# Patient Record
Sex: Female | Born: 1995 | Race: Black or African American | Hispanic: No | Marital: Single | Smoking: Never smoker
Health system: Southern US, Community
[De-identification: ages and names within clinical notes are randomized; demographics above are authoritative.]

## PROBLEM LIST (undated history)

## (undated) ENCOUNTER — Inpatient Hospital Stay (HOSPITAL_COMMUNITY): Payer: Self-pay

## (undated) DIAGNOSIS — J302 Other seasonal allergic rhinitis: Secondary | ICD-10-CM

## (undated) DIAGNOSIS — F329 Major depressive disorder, single episode, unspecified: Secondary | ICD-10-CM

## (undated) DIAGNOSIS — F32A Depression, unspecified: Secondary | ICD-10-CM

## (undated) HISTORY — DX: Major depressive disorder, single episode, unspecified: F32.9

## (undated) HISTORY — DX: Depression, unspecified: F32.A

## (undated) HISTORY — PX: NO PAST SURGERIES: SHX2092

## (undated) HISTORY — PX: ACHILLES TENDON REPAIR: SUR1153

---
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
## 2013-01-10 ENCOUNTER — Encounter (HOSPITAL_COMMUNITY): Payer: Self-pay

## 2013-01-10 ENCOUNTER — Inpatient Hospital Stay (HOSPITAL_COMMUNITY)
Admission: AD | Admit: 2013-01-10 | Discharge: 2013-01-10 | Disposition: A | Discharge status: Home / Self Care | Payer: Federal, State, Local not specified - PPO | Source: Ambulatory Visit | Attending: Obstetrics & Gynecology | Admitting: Obstetrics & Gynecology

## 2013-01-10 DIAGNOSIS — Z3201 Encounter for pregnancy test, result positive: Secondary | ICD-10-CM | POA: Insufficient documentation

## 2013-01-10 LAB — POCT PREGNANCY, URINE: Preg Test, Ur: POSITIVE — AB

## 2013-01-10 NOTE — MAU Provider Note (Signed)
  History     CSN: [id number]  Arrival date and time: 01/10/13 2312   None     No chief complaint on file.  HPI  Stacey Rhodes is a 17 y.o. who presents today for a pregnancy test. She states that she took one at home what was positive, but she wanted to confirm. She denies any pain or bleeding.   Past Medical History  Diagnosis Date  . Asthma   . Seasonal allergies     No past surgical history on file.  Family History  Problem Relation Age of Onset  . Diabetes Other   . Cancer Other   . Hyperlipidemia Other   . Asthma Other     History  Substance Use Topics  . Smoking status: Never Smoker   . Smokeless tobacco: Not on file  . Alcohol Use: No    Allergies: No Known Allergies  No prescriptions prior to admission    ROS Physical Exam   Blood pressure 112/68, pulse 82, temperature 99.5 F (37.5 C), temperature source Oral, resp. rate 16, height 5\' 7"  (1.702 m), weight 68.765 kg (151 lb 9.6 oz), last menstrual period 11/08/2012, SpO2 100.00%.  Physical Exam  MAU Course  Procedures  Results for orders placed during the hospital encounter of 01/10/13 (from the past 24 hour(s))  POCT PREGNANCY, URINE     Status: Abnormal   Collection Time    01/10/13 11:30 PM      Result Value Range   Preg Test, Ur POSITIVE (*) NEGATIVE     Assessment and Plan   1. Encounter for pregnancy test, result positive    First trimester warning signs reviewed Start [facility] as soon as possible Return to MAU as needed  Tawnya Crook 01/10/2013, 11:32 PM

## 2013-01-10 NOTE — MAU Note (Signed)
Pt states took pregnancy test at home 2 days ago and it was positive. Wanted confirmation here b/c wasn't sure if she could trust the brand she used. Denies vaginal bleeding or abdominal pain. States just wants to confirm pregnancy. Unsure of LMP, thinks it was the middle of May.

## 2013-01-10 NOTE — MAU Provider Note (Signed)

## 2013-01-27 ENCOUNTER — Inpatient Hospital Stay (HOSPITAL_COMMUNITY)
Admission: AD | Admit: 2013-01-27 | Discharge: 2013-01-28 | Disposition: A | Discharge status: Home / Self Care | Payer: Federal, State, Local not specified - PPO | Source: Ambulatory Visit | Attending: Obstetrics and Gynecology | Admitting: Obstetrics and Gynecology

## 2013-01-27 DIAGNOSIS — A499 Bacterial infection, unspecified: Secondary | ICD-10-CM | POA: Insufficient documentation

## 2013-01-27 DIAGNOSIS — O209 Hemorrhage in early pregnancy, unspecified: Secondary | ICD-10-CM | POA: Insufficient documentation

## 2013-01-27 DIAGNOSIS — B9689 Other specified bacterial agents as the cause of diseases classified elsewhere: Secondary | ICD-10-CM | POA: Insufficient documentation

## 2013-01-27 DIAGNOSIS — S3991XA Unspecified injury of abdomen, initial encounter: Secondary | ICD-10-CM

## 2013-01-27 DIAGNOSIS — O2390 Unspecified genitourinary tract infection in pregnancy, unspecified trimester: Secondary | ICD-10-CM | POA: Insufficient documentation

## 2013-01-27 DIAGNOSIS — N760 Acute vaginitis: Secondary | ICD-10-CM | POA: Insufficient documentation

## 2013-01-27 DIAGNOSIS — R109 Unspecified abdominal pain: Secondary | ICD-10-CM | POA: Insufficient documentation

## 2013-01-27 DIAGNOSIS — O26899 Other specified pregnancy related conditions, unspecified trimester: Secondary | ICD-10-CM

## 2013-01-27 LAB — URINE MICROSCOPIC-ADD ON

## 2013-01-27 LAB — URINALYSIS, ROUTINE W REFLEX MICROSCOPIC
Glucose, UA: NEGATIVE mg/dL
Nitrite: NEGATIVE
Specific Gravity, Urine: >1.03 — ABNORMAL HIGH (ref 1.005–1.030)
pH: 6 (ref 5.0–8.0)
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
## 2013-01-28 ENCOUNTER — Inpatient Hospital Stay (HOSPITAL_COMMUNITY): Payer: Federal, State, Local not specified - PPO

## 2013-01-28 ENCOUNTER — Encounter (HOSPITAL_COMMUNITY): Payer: Self-pay

## 2013-01-28 LAB — ABO/RH: ABO/RH(D): A POS

## 2013-01-28 LAB — WET PREP, GENITAL: Trich, Wet Prep: NONE SEEN

## 2013-01-28 MED ORDER — METRONIDAZOLE 500 MG PO TABS: 500.0000 mg | ORAL_TABLET | Freq: Two times a day (BID) | ORAL | Status: AC

## 2013-01-28 MED ORDER — ACETAMINOPHEN 500 MG PO TABS
1000.0000 mg | ORAL_TABLET | Freq: Once | ORAL | Status: AC
  Administered 2013-01-28: 1000 mg via ORAL
  Filled 2013-01-28: qty 2
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
## 2013-01-28 NOTE — MAU Provider Note (Signed)
Reviewed and agree with note and plan. V.Nicola Quesnell, MD  
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
## 2013-01-29 LAB — URINE CULTURE

## 2013-01-29 LAB — GC/CHLAMYDIA PROBE AMP: GC Probe RNA: NEGATIVE

## 2013-02-16 ENCOUNTER — Encounter (HOSPITAL_COMMUNITY): Payer: Self-pay | Admitting: *Deleted

## 2013-02-16 ENCOUNTER — Inpatient Hospital Stay (HOSPITAL_COMMUNITY)
Admission: AD | Admit: 2013-02-16 | Discharge: 2013-02-17 | Disposition: A | Discharge status: Home / Self Care | Payer: Federal, State, Local not specified - PPO | Source: Ambulatory Visit | Attending: Obstetrics & Gynecology | Admitting: Obstetrics & Gynecology

## 2013-02-16 DIAGNOSIS — O99891 Other specified diseases and conditions complicating pregnancy: Secondary | ICD-10-CM | POA: Insufficient documentation

## 2013-02-16 DIAGNOSIS — R109 Unspecified abdominal pain: Secondary | ICD-10-CM | POA: Insufficient documentation

## 2013-02-16 DIAGNOSIS — K219 Gastro-esophageal reflux disease without esophagitis: Secondary | ICD-10-CM

## 2013-02-16 DIAGNOSIS — O26899 Other specified pregnancy related conditions, unspecified trimester: Secondary | ICD-10-CM

## 2013-02-16 MED ORDER — GI COCKTAIL ~~LOC~~
30.0000 mL | Freq: Once | ORAL | Status: AC
  Administered 2013-02-17: 30 mL via ORAL
  Filled 2013-02-16: qty 30

## 2013-02-16 NOTE — MAU Provider Note (Signed)
Chief Complaint: Abdominal Pain  First Provider Initiated Contact with Patient 02/17/13 0012     SUBJECTIVE HPI: Stacey Rhodes is a 17 y.o. G1P0000 at [redacted]w[redacted]d by LMP who presents with intermittent upper abd pain x ~4 days and continued sharp, intermitent bilat abd pain x 3 weeks. GC/CT, wet prep neg a previous visit., Declines repeat,   Past Medical History  Diagnosis Date  . Asthma   . Seasonal allergies    OB History  Gravida Para Term Preterm AB SAB TAB Ectopic Multiple Living  1 0 0 0 0 0 0 0 0 0     # Outcome Date GA Lbr Len/2nd Weight Sex Delivery Anes PTL Lv  1 CUR              Past Surgical History  Procedure Laterality Date  . No past surgeries     History   Social History  . Marital Status: Single    Spouse Name: N/A    Number of Children: N/A  . Years of Education: N/A   Occupational History  . Not on file.   Social History Main Topics  . Smoking status: Never Smoker   . Smokeless tobacco: Not on file  . Alcohol Use: No  . Drug Use: No  . Sexual Activity: Yes    Birth Control/ Protection: None   Other Topics Concern  . Not on file   Social History Narrative  . No narrative on file   No current facility-administered medications on file prior to encounter.   Current Outpatient Prescriptions on File Prior to Encounter  Medication Sig Dispense Refill  . Prenatal Vit-Fe Fumarate-FA (PRENATAL MULTIVITAMIN) TABS tablet Take 1 tablet by mouth daily at 12 noon.       No Known Allergies  ROS: Pertinent pos items in HPI. Neg for fever, chills, N/V/D/C, vaginal bleeding, vaginal discharge, or urinary complaints.  OBJECTIVE Blood pressure 97/57, pulse 81, temperature 97.7 F (36.5 C), temperature source Oral, height 5\' 7"  (1.702 m), weight 67.189 kg (148 lb 2 oz), last menstrual period 11/08/2012. GENERAL: Well-developed, well-nourished female in no acute distress.  HEENT: Normocephalic HEART: normal rate RESP: normal effort ABDOMEN: Soft, non-tender.  Pos BS EXTREMITIES: Nontender, no edema NEURO: Alert and oriented SPECULUM EXAM: Deferred. BIMANUAL: cervix closed and long; uterus 12 week size, no adnexal tenderness or masses FHR 161 by doppler.   LAB RESULTS Results for orders placed during the hospital encounter of 02/16/13 (from the past 24 hour(s))  URINALYSIS, ROUTINE W REFLEX MICROSCOPIC     Status: Abnormal   Collection Time    02/16/13 11:19 PM      Result Value Range   Color, Urine YELLOW  YELLOW   APPearance CLEAR  CLEAR   Specific Gravity, Urine 1.025  1.005 - 1.030   pH 6.0  5.0 - 8.0   Glucose, UA NEGATIVE  NEGATIVE mg/dL   Hgb urine dipstick NEGATIVE  NEGATIVE   Bilirubin Urine NEGATIVE  NEGATIVE   Ketones, ur NEGATIVE  NEGATIVE mg/dL   Protein, ur NEGATIVE  NEGATIVE mg/dL   Urobilinogen, UA 1.0  0.0 - 1.0 mg/dL   Nitrite NEGATIVE  NEGATIVE   Leukocytes, UA TRACE (*) NEGATIVE  URINE MICROSCOPIC-ADD ON     Status: Abnormal   Collection Time    02/16/13 11:19 PM      Result Value Range   Squamous Epithelial / LPF FEW (*) RARE   WBC, UA 3-6  <3 WBC/hpf   Bacteria, UA FEW (*) RARE  Urine-Other MUCOUS PRESENT      IMAGING NA  MAU COURSE Pain resolved after GI cocktail.   ASSESSMENT 1. Gastroesophageal reflux in pregancy in first trimester   2. Pain of round ligament complicating pregnancy, antepartum    PLAN Discharge home in stable condition.   GERD diet. Comfort measures.  Follow-up Information   Follow up with [facility] On 03/10/2013.   Specialty:  Obstetrics and Gynecology   Contact information:   [address] [place] [address] [phone]      Follow up with [facility] [place] [facility] ADMISSIONS. (as needed in emergencies)    Contact information:   [address] [id number] [place] [address] [phone]       Medication List         famotidine 20 MG tablet  Commonly known as:  PEPCID  Take 1 tablet (20 mg total) by mouth 2  (two) times daily as needed for heartburn.     prenatal multivitamin Tabs tablet  Take 1 tablet by mouth daily at 12 noon.       [place], [place] 02/16/2013  11:53 PM

## 2013-02-16 NOTE — MAU Note (Signed)
PT SAYS SHE HAS BEEN HAVING PAIN ON BOTH SIDES   X3 WEEKS AGO-  SINCE SHE WAS HERE.   SAYS HAS ABD PAIN- THAT STARTED ON Friday.  NO MEDS FOR PAIN.   HAS AN APPOINTMENT  FOR PNC-  WITH CLINIC.   LAST SEX- 5-25.

## 2013-02-16 NOTE — MAU Note (Signed)
Pt states she has been having abdominal  Pain pt states she has also had pain on both sides for the past 3wks

## 2013-02-17 LAB — URINALYSIS, ROUTINE W REFLEX MICROSCOPIC
Glucose, UA: NEGATIVE mg/dL
Ketones, ur: NEGATIVE mg/dL
Nitrite: NEGATIVE
Specific Gravity, Urine: 1.025 (ref 1.005–1.030)
pH: 6 (ref 5.0–8.0)

## 2013-02-17 LAB — URINE MICROSCOPIC-ADD ON

## 2013-02-17 MED ORDER — FAMOTIDINE 20 MG PO TABS: 20.0000 mg | ORAL_TABLET | Freq: Two times a day (BID) | ORAL | Status: DC | PRN

## 2013-02-18 LAB — URINE CULTURE

## 2013-02-19 ENCOUNTER — Other Ambulatory Visit: Payer: Self-pay | Admitting: Family

## 2013-02-20 ENCOUNTER — Other Ambulatory Visit: Payer: Self-pay | Admitting: Obstetrics and Gynecology

## 2013-02-20 ENCOUNTER — Telehealth: Payer: Self-pay | Admitting: Obstetrics and Gynecology

## 2013-02-20 MED ORDER — SULFAMETHOXAZOLE-TRIMETHOPRIM 800-160 MG PO TABS: 1.0000 | ORAL_TABLET | Freq: Two times a day (BID) | ORAL | Status: AC

## 2013-02-20 NOTE — Telephone Encounter (Signed)
Left a message with a friend who answered her cell phone. She will have Ms. Eastham return my call.  I left my name and my contact number for her to return the call too.

## 2013-02-20 NOTE — Telephone Encounter (Signed)
confirmed allergies  Sent presription to pharmacy: Bactrim 100 mg PO BID times 7 days. Pt voices understanding.

## 2013-02-21 ENCOUNTER — Telehealth: Payer: Self-pay | Admitting: Advanced Practice Midwife

## 2013-02-21 NOTE — Telephone Encounter (Signed)
Pt called to report UTI medication is not at her pharmacy.  Confirmed correct pharmacy with pt, listed as sent in Epic.  Called pharmacy and called prescription for Bactrim DS BID x7 days for pt and notified pt her prescription was resent.

## 2013-03-02 NOTE — MAU Provider Note (Signed)
Attestation of Attending Supervision of Advanced Practitioner (CNM/NP): Evaluation and management procedures were performed by the Advanced Practitioner under my supervision and collaboration. I have reviewed the Advanced Practitioner's note and chart, and I agree with the management and plan.  Graydon Fofana H. 11:44 AM   

## 2013-03-03 ENCOUNTER — Other Ambulatory Visit: Payer: Self-pay | Admitting: Obstetrics & Gynecology

## 2013-03-03 NOTE — Progress Notes (Signed)
Treat for uti

## 2013-03-04 ENCOUNTER — Other Ambulatory Visit: Payer: Self-pay

## 2013-03-04 ENCOUNTER — Emergency Department (HOSPITAL_COMMUNITY)
Admission: EM | Admit: 2013-03-04 | Discharge: 2013-03-04 | Disposition: A | Discharge status: Home / Self Care | Payer: Federal, State, Local not specified - PPO | Attending: Emergency Medicine | Admitting: Emergency Medicine

## 2013-03-04 ENCOUNTER — Emergency Department (HOSPITAL_COMMUNITY): Payer: Federal, State, Local not specified - PPO

## 2013-03-04 ENCOUNTER — Encounter (HOSPITAL_COMMUNITY): Payer: Self-pay | Admitting: Emergency Medicine

## 2013-03-04 DIAGNOSIS — Z79899 Other long term (current) drug therapy: Secondary | ICD-10-CM | POA: Insufficient documentation

## 2013-03-04 DIAGNOSIS — A499 Bacterial infection, unspecified: Secondary | ICD-10-CM | POA: Insufficient documentation

## 2013-03-04 DIAGNOSIS — J45909 Unspecified asthma, uncomplicated: Secondary | ICD-10-CM | POA: Insufficient documentation

## 2013-03-04 DIAGNOSIS — B9689 Other specified bacterial agents as the cause of diseases classified elsewhere: Secondary | ICD-10-CM | POA: Insufficient documentation

## 2013-03-04 DIAGNOSIS — N760 Acute vaginitis: Secondary | ICD-10-CM | POA: Insufficient documentation

## 2013-03-04 DIAGNOSIS — E860 Dehydration: Secondary | ICD-10-CM

## 2013-03-04 DIAGNOSIS — R109 Unspecified abdominal pain: Secondary | ICD-10-CM | POA: Insufficient documentation

## 2013-03-04 DIAGNOSIS — R55 Syncope and collapse: Secondary | ICD-10-CM | POA: Insufficient documentation

## 2013-03-04 DIAGNOSIS — R42 Dizziness and giddiness: Secondary | ICD-10-CM | POA: Insufficient documentation

## 2013-03-04 DIAGNOSIS — O211 Hyperemesis gravidarum with metabolic disturbance: Secondary | ICD-10-CM | POA: Insufficient documentation

## 2013-03-04 LAB — URINALYSIS, ROUTINE W REFLEX MICROSCOPIC
Bilirubin Urine: NEGATIVE
Nitrite: NEGATIVE
Specific Gravity, Urine: 1.017 (ref 1.005–1.030)
Urobilinogen, UA: 1 mg/dL (ref 0.0–1.0)
pH: 8 (ref 5.0–8.0)

## 2013-03-04 LAB — COMPREHENSIVE METABOLIC PANEL
ALT: 16 U/L (ref 0–35)
AST: 25 U/L (ref 0–37)
CO2: 22 mEq/L (ref 19–32)
Calcium: 9.1 mg/dL (ref 8.4–10.5)
Creatinine, Ser: 0.53 mg/dL (ref 0.50–1.10)
GFR calc Af Amer: >90 mL/min (ref >90)
GFR calc non Af Amer: >90 mL/min (ref >90)
Glucose, Bld: 87 mg/dL (ref 70–99)
Sodium: 132 mEq/L — ABNORMAL LOW (ref 135–145)
Total Protein: 6.9 g/dL (ref 6.0–8.3)

## 2013-03-04 LAB — CBC
MCH: 29.7 pg (ref 26.0–34.0)
MCHC: 35.4 g/dL (ref 30.0–36.0)
MCV: 84 fL (ref 78.0–100.0)
Platelets: 296 10*3/uL (ref 150–400)

## 2013-03-04 LAB — URINE MICROSCOPIC-ADD ON

## 2013-03-04 LAB — POCT PREGNANCY, URINE: Preg Test, Ur: POSITIVE — AB

## 2013-03-04 LAB — POCT I-STAT TROPONIN I

## 2013-03-04 LAB — WET PREP, GENITAL
Trich, Wet Prep: NONE SEEN
Yeast Wet Prep HPF POC: NONE SEEN

## 2013-03-04 MED ORDER — METRONIDAZOLE 500 MG PO TABS: 500.0000 mg | ORAL_TABLET | Freq: Two times a day (BID) | ORAL | Status: DC

## 2013-03-04 MED ORDER — SODIUM CHLORIDE 0.9 % IV BOLUS (SEPSIS)
1000.0000 mL | Freq: Once | INTRAVENOUS | Status: AC
  Administered 2013-03-04: 1000 mL via INTRAVENOUS

## 2013-03-04 NOTE — ED Notes (Signed)
Patient transported to Ultrasound 

## 2013-03-04 NOTE — ED Notes (Signed)
Pt arrives to ed via [facility] for syncopal episode 1 hr pta.  Pt c/o ABD pain and CP.  Pt is [redacted] weeks pregnant confirmed by [place].  Pt denies sob, recent illness/injury, n/v/d, vaginal discharge..  Pt caox4, pmsx4, nad.

## 2013-03-04 NOTE — ED Provider Notes (Signed)
CSN: [id number]     Arrival date & time 03/04/13  1726 History   First MD Initiated Contact with Patient 03/04/13 1742     Chief Complaint  Patient presents with  . Near Syncope   (Consider location/radiation/quality/duration/timing/severity/associated sxs/prior Treatment) The history is provided by the patient. No language interpreter was used.  Stacey Rhodes is a 17 year old female, who is currently [redacted] weeks pregnant with an ultrasound confirmed on 01/28/2013, brought in by EMS after feeling dizzy and having a syncopal episode when walking around [place]. Patient reported that suddenly she felt dizzy and vision began to blur, bilaterally. As per friend that was with the patient, a friend reported that patient hit the floor, reported that her head landed on her arm-denied any head injury or contact of the head to the floor. Reported the patient was nonresponsive, reported patient would not respond or talk back when the patient was spoken to. Reported that patient's eyes were open patient became stiff and started to shake- friend reported that this lasted approximately 10-12 minutes. Friend reported that when patient finally came to patient started to point to her throat and chest, gasping for air. Friend reported the patient was disoriented for a couple minutes after the event when she finally came to. Patient denied ever having this occur before her. Patient reported that she was recently seen by her wic provider where her iron level was approximately 8.6-8.7, physician reported to bring up the iron via foods or starting patient on iron supplementation. Patient reported that she started to experience abdominal discomfort localized to lower abdomen described as a sharp pain that is constant without radiation prior to arrival to the emergency department. Patient denied head injury, abdominal cramping, weakness, numbness, tingling, cough, fever, chills, chest pain, shortness of breath, difficulty  breathing, vaginal discharge, vaginal bleeding. PCP none  Past Medical History  Diagnosis Date  . Asthma   . Seasonal allergies    Past Surgical History  Procedure Laterality Date  . No past surgeries     Family History  Problem Relation Age of Onset  . Diabetes Other   . Cancer Other   . Hyperlipidemia Other   . Asthma Other    History  Substance Use Topics  . Smoking status: Never Smoker   . Smokeless tobacco: Not on file  . Alcohol Use: No   OB History   Grav Para Term Preterm Abortions TAB SAB Ect Mult Living   1 0 0 0 0 0 0 0 0 0      Review of Systems  Constitutional: Negative for fever and chills.  HENT: Negative for neck pain and neck stiffness.   Eyes: Negative for visual disturbance.  Respiratory: Negative for chest tightness and shortness of breath.   Cardiovascular: Negative for chest pain.  Gastrointestinal: Positive for abdominal pain. Negative for nausea, vomiting, diarrhea, blood in stool and anal bleeding.  Genitourinary: Negative for dysuria, vaginal bleeding, vaginal discharge and vaginal pain.  Neurological: Positive for dizziness and syncope.  All other systems reviewed and are negative.    Allergies  Review of patient's allergies indicates no known allergies.  Home Medications   Current Outpatient Rx  Name  Route  Sig  Dispense  Refill  . Prenatal Vit-Fe Fumarate-FA (PRENATAL MULTIVITAMIN) TABS tablet   Oral   Take 1 tablet by mouth daily at 12 noon.         . sulfamethoxazole-trimethoprim (BACTRIM DS) 800-160 MG per tablet   Oral   Take 1 tablet  by mouth 2 (two) times daily.         . famotidine (PEPCID) 20 MG tablet   Oral   Take 1 tablet (20 mg total) by mouth 2 (two) times daily as needed for heartburn.   30 tablet   4   . metroNIDAZOLE (FLAGYL) 500 MG tablet   Oral   Take 1 tablet (500 mg total) by mouth 2 (two) times daily. One po bid x 7 days   14 tablet   0    BP 115/73  Pulse 87  Temp(Src) 99 F (37.2 C)  (Oral)  Resp 14  Ht 5\' 9"  (1.753 m)  Wt 148 lb (67.132 kg)  BMI 21.85 kg/m2  SpO2 100%  LMP 11/08/2012 Physical Exam  Nursing note and vitals reviewed. Constitutional: She is oriented to person, place, and time. She appears well-developed and well-nourished. No distress.  HENT:  Head: Normocephalic and atraumatic.  Mouth/Throat: Oropharynx is clear and moist. No oropharyngeal exudate.  Negative facial trauma  Eyes: Conjunctivae and EOM are normal. Pupils are equal, round, and reactive to light. Right eye exhibits no discharge.  Neck: Normal range of motion. Neck supple.  Negative neck stiffness Negative nuchal rigidity  Negative pain upon palpation to cervical spine   Cardiovascular: Normal rate, regular rhythm and normal heart sounds.  Exam reveals no friction rub.   No murmur heard. Pulses:      Radial pulses are 2+ on the right side, and 2+ on the left side.       Dorsalis pedis pulses are 2+ on the right side, and 2+ on the left side.  Pulmonary/Chest: Effort normal and breath sounds normal. No respiratory distress. She has no wheezes. She has no rales. She exhibits no tenderness.  Negative respiratory distress identified.   Abdominal: Soft. Bowel sounds are normal. She exhibits no distension. There is tenderness. There is no guarding.    Mild discomfort upon palpation to the right lower quadrant, suprapubic, left low quadrant region  Negative McBurney's point  Genitourinary: Vaginal discharge found.  Negative swelling, erythema, inflammation, sores, lesions noted external genitalia. Negative blood in the vaginal vault. Negative swelling, erythema, inflammation, sores, lesions noted to the vaginal canal. Negative erythema, inflammation, swelling noted to the cervical os. Thick white vaginal discharge, negative odor identified. Pain upon palpation with positive CMT tenderness, mild right and left adnexal tenderness identified with palpation.  Musculoskeletal: Normal range of  motion.  Lymphadenopathy:    She has no cervical adenopathy.  Neurological: She is alert and oriented to person, place, and time. No cranial nerve deficit. She exhibits normal muscle tone. Coordination normal. GCS eye subscore is 4. GCS verbal subscore is 5. GCS motor subscore is 6.  Cranial nerves III through XII grossly intact Strength 5+/5+ to upper and lower extremities bilaterally with resistance, equal distribution identified Sensation intact to upper and lower extremities bilaterally Negative arm drift bilaterally Straight leg raise identified with negative difficulty Responds to questions appropriately Follows commands appropriately   Skin: Skin is warm and dry. No rash noted. She is not diaphoretic. No erythema.  Psychiatric: She has a normal mood and affect. Her behavior is normal. Thought content normal.    ED Course  Procedures (including critical care time)  This provider reviewed patient's chart. Patient was diagnosed with a single intrauterine pregnancy fetal ultrasounds performed on 01/28/2013.  Heart heart rate check, normal and strong beats - no interruptions and deaccelerations.   7:28 PM Patient seen and assessed by Dr. Kathie Rhodes.  [place]. Recommended [place] for status of infant. Did not recommend CT scan of head due to no head trauma identified.    Date: 03/04/2013  Rate: 90  Rhythm: normal sinus rhythm  QRS Axis: normal  Intervals: normal  ST/T Wave abnormalities: normal  Conduction Disutrbances:none  Narrative Interpretation:   Old EKG Reviewed: none available EKG was analyzed and reviewed by this provider and attending physician.   Labs Review Labs Reviewed  WET PREP, GENITAL - Abnormal; Notable for the following:    Clue Cells Wet Prep HPF POC MANY (*)    WBC, Wet Prep HPF POC FEW (*)    All other components within normal limits  CBC - Abnormal; Notable for the following:    RBC 3.06 (*)    Hemoglobin 9.1 (*)    HCT 25.7 (*)    All other components within  normal limits  COMPREHENSIVE METABOLIC PANEL - Abnormal; Notable for the following:    Sodium 132 (*)    Total Bilirubin 0.2 (*)    All other components within normal limits  URINALYSIS, ROUTINE W REFLEX MICROSCOPIC - Abnormal; Notable for the following:    APPearance CLOUDY (*)    Leukocytes, UA TRACE (*)    All other components within normal limits  URINE MICROSCOPIC-ADD ON - Abnormal; Notable for the following:    Squamous Epithelial / LPF MANY (*)    Crystals CA OXALATE CRYSTALS (*)    All other components within normal limits  POCT PREGNANCY, URINE - Abnormal; Notable for the following:    Preg Test, Ur POSITIVE (*)    All other components within normal limits  GC/CHLAMYDIA PROBE AMP  POCT I-STAT TROPONIN I   Imaging Review US Ob Limited  03/04/2013   *RADIOLOGY REPORT*  Clinical Data: Near-syncopal episode.  LIMITED OBSTETRIC ULTRASOUND  Number of Fetuses: 1 Heart Rate: 163 bpm Movement: Yes Breathing: Presentation: Breech the Placental Location: Anterior  Previa: No Amniotic Fluid (Subjective): Normal  BPD:  3.0 cm      15 w  3 d         EDC:  08/23/13  MATERNAL FINDINGS: Cervix: Closed  IMPRESSION:  1.  Single living intrauterine gestation. 2.  Estimated gestational age is 15 weeks and 3 days. 3.  No complications identified.  Recommend followup with non-emergent complete OB 14+ wk US examination for fetal biometric evaluation and anatomic survey if not already performed.   Original Report Authenticated By: Signa Kell, M.D.    MDM   1. Dehydration   2. Bacterial vaginosis     Patient presenting to emergency department with dizziness and syncopal episode that occurred at approximately 4:00 PM this afternoon while walking around [employer]. As per friends who was with the patient, reported that patient was unresponsive with eyes open and small tremors that lasted approximately 10-12 minutes. Friend reported that when patient finally came to she kept pointing towards her throat and  chest, gasping for air. Patient denied ever having this occur. Alert and oriented. Lungs clear to auscultation bilaterally. Heart rate and rhythm normal. Negative acute abdomen, negative peritoneal signs. Mild discomfort upon palpation to the lower abdominal region. Negative pain upon palpation to the chest wall. Strength intact. Sensation intact. Negative neurological deficits noted. Patient responds to questions appropriately, patient follows commands. GCS 15. Heart rate and rhythm of infant was monitored with normal findings - strong beats.  Discussed case with Dr. Randel Books who saw and assessed patient - as per physician, did not recommend a  CT scan of head at this moment. Recommended [place] to monitor status of infant.   Patient reported mild chest pain - patient not tachycardic, not hypoxic, not tachypneic - Well's PE 0 - doubt PE. EKG negative ischemic changes identified, patient normal sinus rhythm. Negative troponin elevation. CBC decreased hemoglobin of 9.1 and hematocrit 25.7 noted. Mild hyponatremia identified on CMP, sodium 132-patient placed on fluids via IV. Urine negative for infection, negative nitrites. Ultrasounds noted single living intrauterine gestation with estimated gestational age of [redacted] weeks and 3 days-number fetuses is 1 with a heart rate of 163 beats per minute movement is identified with breech presentation. Wet prep identified many clue cells with a few white blood cells. GC Chlamydia probe pending. Negative findings in orthostatic vitals.  Suspicion for possible syncopal episode secondary to dehydration, true definitive etiology unknown. Abdominal pain secondary to bacterial vaginosis. Discussed lab findings and imaging with Dr. Manus Gunning -as per physician cleared patient for discharge. Patient has been steady and stable while in ED setting. Negative episodes of syncope while in ED setting. Status of infant is noted, negative abnormalities-heart rate normal. Patient stable, afebrile.  Discharged patient with antibiotics for bacterial vaginosis is identified in the wet prep. Referred [facility] to be reevaluated. Discussed with patient to rest and stay hydrated. Discussed with patient to avoid any strenuous activity. Discussed with patient to stay cool especially with the heat of summer. Discussed with patient to monitor symptoms closely and if symptoms are to worsen or change to report back to emergency department-strict return instructions given. Patient agreed to plan of care, understood, all questions answered.      Raymon Mutton, PA-C 03/05/13 1633  Rivaan Kendall, PA-C 03/05/13 1637  Ihor Meinzer, PA-C 03/05/13 1638

## 2013-03-06 NOTE — ED Provider Notes (Signed)
Medical screening examination/treatment/procedure(s) were conducted as a shared visit with non-physician practitioner(s) and myself.  I personally evaluated the patient during the encounter  14 weeks confirmed IUP with syncopal episode while walking.  Preceded by lightheadedness, dizziness, nausea.  No head injury. No chest pain or SOB.  Back to baseline now.   Friend reports patient was "unresponsive" for 10 minutes but patient states she remembers everything.  No tachycardia or hypoxia, doubt PE. No brugada or prolonged QT.  Glynn Octave, MD 03/06/13 [phone]

## 2013-03-10 ENCOUNTER — Encounter: Payer: Federal, State, Local not specified - PPO | Admitting: Advanced Practice Midwife

## 2013-04-11 NOTE — Progress Notes (Signed)
Pt contacted regarding UTI.

## 2013-04-19 ENCOUNTER — Encounter: Payer: Self-pay | Admitting: Advanced Practice Midwife

## 2013-04-19 ENCOUNTER — Ambulatory Visit (INDEPENDENT_AMBULATORY_CARE_PROVIDER_SITE_OTHER): Payer: Federal, State, Local not specified - PPO | Admitting: Advanced Practice Midwife

## 2013-04-19 VITALS — BP 114/77 | Temp 97.0°F | Ht 68.0 in | Wt 155.7 lb

## 2013-04-19 DIAGNOSIS — Z9149 Other personal history of psychological trauma, not elsewhere classified: Secondary | ICD-10-CM | POA: Insufficient documentation

## 2013-04-19 DIAGNOSIS — IMO0002 Reserved for concepts with insufficient information to code with codable children: Secondary | ICD-10-CM

## 2013-04-19 DIAGNOSIS — O0930 Supervision of pregnancy with insufficient antenatal care, unspecified trimester: Secondary | ICD-10-CM | POA: Insufficient documentation

## 2013-04-19 DIAGNOSIS — O0932 Supervision of pregnancy with insufficient antenatal care, second trimester: Secondary | ICD-10-CM | POA: Insufficient documentation

## 2013-04-19 DIAGNOSIS — J45909 Unspecified asthma, uncomplicated: Secondary | ICD-10-CM | POA: Insufficient documentation

## 2013-04-19 DIAGNOSIS — Z23 Encounter for immunization: Secondary | ICD-10-CM

## 2013-04-19 NOTE — Patient Instructions (Signed)
Pregnancy - Second Trimester The second trimester is the period between 13 to 27 weeks of your pregnancy. It is important to follow your doctor's instructions. HOME CARE   Do not smoke.  Do not drink alcohol or use drugs.  Only take medicine as told by your doctor.  Take prenatal vitamins as told. The vitamin should contain 1 milligram of folic acid.  Exercise.  Eat healthy foods. Eat regular, well-balanced meals.  You can have sex (intercourse) if there are no other problems with the pregnancy.  Do not use hot tubs, steam rooms, or saunas.  Wear a seat belt while driving.  Avoid raw meat, uncooked cheese, and litter boxes and soil used by cats.  Visit your dentist. Cleanings are okay. GET HELP RIGHT AWAY IF:   You have a temperature by mouth above 102 F (38.9 C), not controlled by medicine.  Fluid is coming from your vagina.  Blood is coming from your vagina. Light spotting is common, especially after sex (intercourse).  You have a bad smelling fluid (discharge) coming from the vagina. The fluid changes from clear to white.  You still feel sick to your stomach (nauseous).  You throw up (vomit) blood.  You lose or gain more than 2 pounds (0.9 kilograms) of weight in a week, or as suggested by your doctor.  Your face, hands, feet, or legs get puffy (swell).  You get exposed to German measles and have never had them.  You get exposed to fifth disease or chickenpox.  You have belly (abdominal) pain.  You have a bad headache that will not go away.  You have watery poop (diarrhea), pain when you pee (urinate), or have shortness of breath.  You start to have problems seeing (blurry or double vision).  You fall, are in a car accident, or have any kind of trauma.  There is mental or physical violence at home.  You have any concerns or worries during your pregnancy. MAKE SURE YOU:   Understand these instructions.  Will watch your condition.  Will get help  right away if you are not doing well or get worse. Document Released: 09/04/2009 Document Revised: 09/02/2011 Document Reviewed: 09/04/2009 [employer] Patient Information 2014 [employer].  
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
## 2013-04-19 NOTE — Progress Notes (Signed)
Pulse- 86 Patient reports epigastric pain 2 hours after eating

## 2013-04-21 ENCOUNTER — Encounter: Payer: Self-pay | Admitting: *Deleted

## 2013-04-22 ENCOUNTER — Ambulatory Visit (HOSPITAL_COMMUNITY): Admission: RE | Admit: 2013-04-22 | Payer: Federal, State, Local not specified - PPO | Source: Ambulatory Visit

## 2013-04-22 ENCOUNTER — Other Ambulatory Visit: Payer: Self-pay | Admitting: Advanced Practice Midwife

## 2013-04-22 ENCOUNTER — Ambulatory Visit (HOSPITAL_COMMUNITY)
Admission: RE | Admit: 2013-04-22 | Discharge: 2013-04-22 | Disposition: A | Discharge status: Home / Self Care | Payer: Federal, State, Local not specified - PPO | Source: Ambulatory Visit | Attending: Advanced Practice Midwife | Admitting: Advanced Practice Midwife

## 2013-04-22 DIAGNOSIS — O0932 Supervision of pregnancy with insufficient antenatal care, second trimester: Secondary | ICD-10-CM

## 2013-04-22 DIAGNOSIS — O0930 Supervision of pregnancy with insufficient antenatal care, unspecified trimester: Secondary | ICD-10-CM | POA: Insufficient documentation

## 2013-04-22 DIAGNOSIS — Z3689 Encounter for other specified antenatal screening: Secondary | ICD-10-CM | POA: Insufficient documentation

## 2013-04-23 ENCOUNTER — Encounter: Payer: Self-pay | Admitting: Advanced Practice Midwife

## 2013-04-30 ENCOUNTER — Telehealth: Payer: Self-pay | Admitting: *Deleted

## 2013-04-30 NOTE — Telephone Encounter (Signed)
Pt left message wanting to know what medicine she can take for allergies. I called pt and informed her that she may take Zyrtec, Allegra or Claritin. She stated that she has Benadryl and wanted to know if that is safe. I tild her that she may take the Benadryl however it may make her sleepy.  Pt voiced understanding.

## 2013-05-18 ENCOUNTER — Encounter: Payer: Federal, State, Local not specified - PPO | Admitting: Obstetrics & Gynecology

## 2013-05-18 ENCOUNTER — Ambulatory Visit (INDEPENDENT_AMBULATORY_CARE_PROVIDER_SITE_OTHER): Payer: Federal, State, Local not specified - PPO | Admitting: Obstetrics & Gynecology

## 2013-05-18 ENCOUNTER — Encounter: Payer: Self-pay | Admitting: Obstetrics & Gynecology

## 2013-05-18 VITALS — BP 115/66 | Temp 97.9°F | Wt 163.1 lb

## 2013-05-18 DIAGNOSIS — O0930 Supervision of pregnancy with insufficient antenatal care, unspecified trimester: Secondary | ICD-10-CM

## 2013-05-18 DIAGNOSIS — O0932 Supervision of pregnancy with insufficient antenatal care, second trimester: Secondary | ICD-10-CM

## 2013-05-18 LAB — POCT URINALYSIS DIP (DEVICE)
Bilirubin Urine: NEGATIVE
Glucose, UA: NEGATIVE mg/dL
Ketones, ur: NEGATIVE mg/dL
Nitrite: NEGATIVE
Specific Gravity, Urine: >=1.03 (ref 1.005–1.030)

## 2013-05-18 NOTE — Progress Notes (Signed)
P= 96 C/o of lower abdominal/pelvic pressure.

## 2013-05-18 NOTE — Patient Instructions (Signed)
Glucose Tolerance Test During Pregnancy The glucose tolerance test (GTT) or 3-hour glucose test can be used to determine if a woman has diabetes that first begins or is first recognized during pregnancy (gestational diabetes). Typically, a GTT is done after you have had a 1-hour glucose test with results that indicate you possibly have gestational diabetes.  The test takes about 3 hours. There will be a series of blood tests after you drink the sugar water solution. You must remain at the testing location to make sure that your blood is drawn on time.  LET YOUR CAREGIVER KNOW ABOUT:  Allergies to food or medicine.  Medicines taken, including vitamins, herbs, eyedrops, over-the-counter medicines, and creams.  Any recent illnesses or infections. BEFORE THE PROCEDURE The GTT is a fasting test, meaning you must stop eating for a certain amount of time. The test will be the most accurate if you have not eaten for 8 12 hours before the test. For this reason, it is recommended that you have this test done in the morning before you have breakfast. PROCEDURE  Do not eat or drink anything but water during the test. When you arrive at the lab, a sample of your blood is taken to get your fasting blood glucose level. After your fasting glucose level is determined, you will be given a sugar water solution to drink. You will be asked to wait in a certain area until your next blood test. The blood tests are done each hour for 3 hours. Stay close to the lab so your blood samples can be taken on time. This is important. If the blood samples are not taken on time, the test will need to be done again on another day.  AFTER THE PROCEDURE  You can eat and drink as usual.   Ask when your test results will be ready. Make sure you get your test results. A positive test is considered when two of the four blood test values are equal or above the normal blood glucose level. Document Released: 12/10/2011 Document Reviewed:  12/10/2011 [facility] Patient Information 2014 [place], [place]. Etonogestrel implant What is this medicine? ETONOGESTREL (et oh noe JES trel) is a contraceptive (birth control) device. It is used to prevent pregnancy. It can be used for up to 3 years. This medicine may be used for other purposes; ask your health care provider or pharmacist if you have questions. COMMON BRAND NAME(S): Implanon, Nexplanon  What should I tell my health care provider before I take this medicine? They need to know if you have any of these conditions: -abnormal vaginal bleeding -blood vessel disease or blood clots -cancer of the breast, cervix, or liver -depression -diabetes -gallbladder disease -headaches -heart disease or recent heart attack -high blood pressure -high cholesterol -kidney disease -liver disease -renal disease -seizures -tobacco smoker -an unusual or allergic reaction to etonogestrel, other hormones, anesthetics or antiseptics, medicines, foods, dyes, or preservatives -pregnant or trying to get pregnant -breast-feeding How should I use this medicine? This device is inserted just under the skin on the inner side of your upper arm by a health care professional. Talk to your pediatrician regarding the use of this medicine in children. Special care may be needed. Overdosage: If you think you've taken too much of this medicine contact a poison control center or emergency room at once. Overdosage: If you think you have taken too much of this medicine contact a poison control center or emergency room at once. NOTE: This medicine is only for you.  Do not share this medicine with others. What if I miss a dose? This does not apply. What may interact with this medicine? Do not take this medicine with any of the following medications: -amprenavir -bosentan -fosamprenavir This medicine may also interact with the following medications: -barbiturate medicines for inducing sleep or treating  seizures -certain medicines for fungal infections like ketoconazole and itraconazole -griseofulvin -medicines to treat seizures like carbamazepine, felbamate, oxcarbazepine, phenytoin, topiramate -modafinil -phenylbutazone -rifampin -some medicines to treat HIV infection like atazanavir, indinavir, lopinavir, nelfinavir, tipranavir, ritonavir -St. John's wort This list may not describe all possible interactions. Give your health care provider a list of all the medicines, herbs, non-prescription drugs, or dietary supplements you use. Also tell them if you smoke, drink alcohol, or use illegal drugs. Some items may interact with your medicine. What should I watch for while using this medicine? This product does not protect you against HIV infection (AIDS) or other sexually transmitted diseases. You should be able to feel the implant by pressing your fingertips over the skin where it was inserted. Tell your doctor if you cannot feel the implant. What side effects may I notice from receiving this medicine? Side effects that you should report to your doctor or health care professional as soon as possible: -allergic reactions like skin rash, itching or hives, swelling of the face, lips, or tongue -breast lumps -changes in vision -confusion, trouble speaking or understanding -dark urine -depressed mood -general ill feeling or flu-like symptoms -light-colored stools -loss of appetite, nausea -right upper belly pain -severe headaches -severe pain, swelling, or tenderness in the abdomen -shortness of breath, chest pain, swelling in a leg -signs of pregnancy -sudden numbness or weakness of the face, arm or leg -trouble walking, dizziness, loss of balance or coordination -unusual vaginal bleeding, discharge -unusually weak or tired -yellowing of the eyes or skin Side effects that usually do not require medical attention (Report these to your doctor or health care professional if they continue or  are bothersome.): -acne -breast pain -changes in weight -cough -fever or chills -headache -irregular menstrual bleeding -itching, burning, and vaginal discharge -pain or difficulty passing urine -sore throat This list may not describe all possible side effects. Call your doctor for medical advice about side effects. You may report side effects to FDA at 1-800-FDA-1088. Where should I keep my medicine? This drug is given in a hospital or clinic and will not be stored at home. NOTE: This sheet is a summary. It may not cover all possible information. If you have questions about this medicine, talk to your doctor, pharmacist, or health care provider.  2014, Elsevier/Gold Standard. (2011-12-16 15:37:45)

## 2013-05-18 NOTE — Progress Notes (Signed)
Pt c/o pelvic pressure.  Not severe  1 hour GTT today F/u 4 weeks Nexplanon for contraception

## 2013-05-19 ENCOUNTER — Encounter: Payer: Self-pay | Admitting: Obstetrics & Gynecology

## 2013-05-19 LAB — OBSTETRIC PANEL
Antibody Screen: NEGATIVE
Basophils Absolute: 0 10*3/uL (ref 0.0–0.1)
Eosinophils Absolute: 0.2 10*3/uL (ref 0.0–0.7)
Eosinophils Relative: 2 % (ref 0–5)
HCT: 27.1 % — ABNORMAL LOW (ref 36.0–46.0)
Lymphocytes Relative: 23 % (ref 12–46)
MCH: 30.1 pg (ref 26.0–34.0)
MCHC: 34.3 g/dL (ref 30.0–36.0)
MCV: 87.7 fL (ref 78.0–100.0)
Monocytes Absolute: 0.7 10*3/uL (ref 0.1–1.0)
Platelets: 341 10*3/uL (ref 150–400)
RDW: 13.6 % (ref 11.5–15.5)
Rubella: 1.79 Index — ABNORMAL HIGH (ref <0.90)
WBC: 7.1 10*3/uL (ref 4.0–10.5)

## 2013-05-19 LAB — ALCOHOL METABOLITE (ETG), URINE

## 2013-05-19 LAB — GC/CHLAMYDIA PROBE AMP
CT Probe RNA: NEGATIVE
GC Probe RNA: NEGATIVE

## 2013-05-19 LAB — CULTURE, OB URINE: Colony Count: >=100000

## 2013-05-19 LAB — HIV ANTIBODY (ROUTINE TESTING W REFLEX): HIV: NONREACTIVE

## 2013-05-21 LAB — HEMOGLOBINOPATHY EVALUATION
Hgb F Quant: 0 % (ref 0.0–2.0)
Hgb S Quant: 0 %

## 2013-05-24 LAB — CARISOPRODOL (GC/LC/MS), URINE
CARISOPRODOL (GC/LC/MS), ur confirm: NEGATIVE ng/mL
Meprobamate (GC/LC/MS), ur confirm: NEGATIVE ng/mL

## 2013-05-24 LAB — ETHYL GLUCURONIDE, URINE: Ethyl Glucuronide (EtG): NEGATIVE ng/mL

## 2013-05-25 LAB — PRESCRIPTION MONITORING PROFILE (19 PANEL)
Amphetamine/Meth: NEGATIVE ng/mL
Barbiturate Screen, Urine: NEGATIVE ng/mL
Cannabinoid Scrn, Ur: NEGATIVE ng/mL
Cocaine Metabolites: NEGATIVE ng/mL
Creatinine, Urine: 249.26 mg/dL (ref >20.0)
Fentanyl, Ur: NEGATIVE ng/mL
MDMA URINE: NEGATIVE ng/mL
Meperidine, Ur: NEGATIVE ng/mL
Methadone Screen, Urine: NEGATIVE ng/mL
Methaqualone: NEGATIVE ng/mL
Oxycodone Screen, Ur: NEGATIVE ng/mL
Phencyclidine, Ur: NEGATIVE ng/mL
Tapentadol, urine: NEGATIVE ng/mL
pH, Initial: 7.3 pH (ref 4.5–8.9)

## 2013-05-31 DIAGNOSIS — D649 Anemia, unspecified: Secondary | ICD-10-CM | POA: Insufficient documentation

## 2013-05-31 DIAGNOSIS — F322 Major depressive disorder, single episode, severe without psychotic features: Secondary | ICD-10-CM | POA: Insufficient documentation

## 2013-06-03 ENCOUNTER — Encounter: Payer: Self-pay | Admitting: Obstetrics & Gynecology

## 2013-06-03 ENCOUNTER — Encounter (HOSPITAL_COMMUNITY): Payer: Federal, State, Local not specified - PPO | Attending: Psychiatry | Admitting: Psychiatry

## 2013-06-03 ENCOUNTER — Telehealth: Payer: Self-pay | Admitting: Obstetrics & Gynecology

## 2013-06-03 ENCOUNTER — Encounter (HOSPITAL_COMMUNITY): Payer: Self-pay

## 2013-06-03 DIAGNOSIS — F329 Major depressive disorder, single episode, unspecified: Secondary | ICD-10-CM | POA: Insufficient documentation

## 2013-06-03 DIAGNOSIS — F411 Generalized anxiety disorder: Secondary | ICD-10-CM | POA: Insufficient documentation

## 2013-06-03 DIAGNOSIS — F3289 Other specified depressive episodes: Secondary | ICD-10-CM | POA: Insufficient documentation
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
## 2013-06-03 NOTE — Telephone Encounter (Signed)
06/03/2013 Call received from [facility] behavioral health program.  Pt has been accepted into the 2-3 week intensive outpt program.  She was offered meds (Prozac) but, declined.

## 2013-06-03 NOTE — Progress Notes (Signed)
    Daily Group Progress Note  Program: IOP  Group Time: 9:00-10:30 am   Participation Level: None  Behavioral Response: Appropriate and none  Type of Therapy:  Initial Assessment  Summary of Progress: Pt completed the initial assessment and orientation with the case manager.      Group Time: 10:30 am - 12:00 pm   Participation Level:  None  Behavioral Response: none  Type of Therapy: Initial Assessment  Summary of Progress: Pt completed the initial medication assessment by the psychiatrist.    Carman Ching, LCSW

## 2013-06-04 ENCOUNTER — Encounter (HOSPITAL_COMMUNITY): Payer: Federal, State, Local not specified - PPO | Admitting: Psychiatry

## 2013-06-04 DIAGNOSIS — F329 Major depressive disorder, single episode, unspecified: Secondary | ICD-10-CM
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
## 2013-06-04 NOTE — Progress Notes (Signed)
    Daily Group Progress Note  Program: IOP  Group Time: 9:00-10:30 am   Participation Level: Active  Behavioral Response: Appropriate  Type of Therapy:  Process Group  Summary of Progress: Today was Pts first full day in the group. She worked on her goal of depression by trying to trust the group to feel comfortable sharing. Pt was attentive and observed the group process.      Group Time: 10:30 am - 12:00 pm   Participation Level:  Active  Behavioral Response: Appropriate  Type of Therapy: Psycho-education Group  Summary of Progress: Pt finished learning about healthy boundary setting and learned how to set limits with people who don't respect boundaries that are set by having consequences in place to support the limits.  Carman Ching, LCSW

## 2013-06-07 ENCOUNTER — Encounter (HOSPITAL_COMMUNITY): Payer: Federal, State, Local not specified - PPO | Admitting: Psychiatry

## 2013-06-07 DIAGNOSIS — F411 Generalized anxiety disorder: Secondary | ICD-10-CM

## 2013-06-07 DIAGNOSIS — F329 Major depressive disorder, single episode, unspecified: Secondary | ICD-10-CM
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
## 2013-06-08 ENCOUNTER — Encounter (HOSPITAL_COMMUNITY): Payer: Federal, State, Local not specified - PPO | Admitting: Psychiatry

## 2013-06-08 DIAGNOSIS — F329 Major depressive disorder, single episode, unspecified: Secondary | ICD-10-CM

## 2013-06-08 NOTE — Progress Notes (Signed)
    Daily Group Progress Note  Program: IOP  Group Time: 9:00-10:30 am   Participation Level: Active  Behavioral Response: Appropriate  Type of Therapy:  Process Group  Summary of Progress: Pt arrived at 9:30 am and received a ride to group from another group member. Pt was dressed up with hair and makeup styled, which was a contrast to yesterday where she was wearing sweat pants. Pt worked on her goal of depression and said she feels better with the support from the group members when she does not feel she has support from her family. Pt is still overwhelmed with being pregnant with so little support and is using the group for strength with this.      Group Time: 10:30 am - 12:00 pm   Participation Level:  Active  Behavioral Response: Appropriate  Type of Therapy: Psycho-education Group  Summary of Progress: pt learned about the medical criteria for depression and how to recognize the symptoms of depression to manage and avoid relapses.   Carman Ching, LCSW

## 2013-06-08 NOTE — Progress Notes (Signed)
Patient ID: Stacey Rhodes, female   DOB: [birth year], 17 y.o.   MRN: [id number] Patient reviewed and interviewed today, states that she is feeling much better as the groups are helping her. She reports getting a lot of feedback and is very involved in heart map. Her sleep and appetite are good patient is less anxious. Denies suicidal or homicidal ideation no hallucinations or delusions. Patient is pregnant and is on no medications.

## 2013-06-09 ENCOUNTER — Encounter (HOSPITAL_COMMUNITY): Payer: Federal, State, Local not specified - PPO

## 2013-06-09 ENCOUNTER — Telehealth (HOSPITAL_COMMUNITY): Payer: Self-pay | Admitting: Psychiatry

## 2013-06-10 ENCOUNTER — Encounter (HOSPITAL_COMMUNITY): Payer: Federal, State, Local not specified - PPO | Admitting: Psychiatry

## 2013-06-10 DIAGNOSIS — F411 Generalized anxiety disorder: Secondary | ICD-10-CM

## 2013-06-10 DIAGNOSIS — F329 Major depressive disorder, single episode, unspecified: Secondary | ICD-10-CM

## 2013-06-10 NOTE — Progress Notes (Signed)
    Daily Group Progress Note  Program: IOP  Group Time: 9:00-10:30 am   Participation Level: Active  Behavioral Response: Appropriate  Type of Therapy:  Process Group  Summary of Progress: pt worked on her goal of depression. Pt has improved affect and reports making connections with other group members. Pt is still processing concerns about having a baby with a lack of support and the stress from not having the support from her mother.      Group Time: 10:30 am - 12:00 pm   Participation Level:  Active  Behavioral Response: Appropriate  Type of Therapy: Psycho-education Group  Summary of Progress: Pt learned about how to make a daily wellness list to ensure mood stability and reduce relapses.   Carman Ching, LCSW

## 2013-06-11 ENCOUNTER — Encounter (HOSPITAL_COMMUNITY): Payer: Federal, State, Local not specified - PPO | Admitting: Psychiatry

## 2013-06-11 DIAGNOSIS — F329 Major depressive disorder, single episode, unspecified: Secondary | ICD-10-CM

## 2013-06-14 ENCOUNTER — Encounter (HOSPITAL_COMMUNITY): Payer: Federal, State, Local not specified - PPO | Admitting: Psychiatry

## 2013-06-14 ENCOUNTER — Encounter (HOSPITAL_COMMUNITY): Payer: Self-pay | Admitting: Psychiatry

## 2013-06-14 DIAGNOSIS — F329 Major depressive disorder, single episode, unspecified: Secondary | ICD-10-CM

## 2013-06-14 NOTE — Progress Notes (Unsigned)
    Daily Group Progress Note  Program: IOP  Group Time: 9-10:30  Participation Level: Minimal  Behavioral Response: Resistant  Type of Therapy:  Process Group  Summary of Progress: Patient was very quiet throughout the process group, participating only when all group members were asked to respond.  She remained attentive throughout the session, however, listening attentively to other members.  Group Time: 10:45-12 pm  Participation Level:  Minimal  Behavioral Response: {CHL AMB BH Group Behavior:[id number]}  Type of Therapy: Psycho-education Group  Summary of Progress: Patient was very quiet throughout the process group, participating only when all group members were asked to respond.  She remained attentive throughout the session, however, listening attentively to other members. She was more engaged and shared about herselfr and her struggles ih this part of group.   Carman Ching, LCSW

## 2013-06-14 NOTE — Progress Notes (Signed)
    Daily Group Progress Note  Program: IOP  Group Time: 9:00-10:30 am   Participation Level: Active  Behavioral Response: Appropriate  Type of Therapy:  Process Group  Summary of Progress: Pt reports feeling "ok" today. She is still having anxiety and depression over her pregnancy but is enjoying the support she is getting from the group. Pt is quiet and requires encouragement to share.      Group Time: 10:30 am - 12:00 pm   Participation Level:  Active  Behavioral Response: Appropriate  Type of Therapy: Psycho-education Group  Summary of Progress: Pt participated in a group with a focus on grief and loss and identified current losses and effective grieving strategies.   Carman Ching, LCSW

## 2013-06-15 ENCOUNTER — Ambulatory Visit (INDEPENDENT_AMBULATORY_CARE_PROVIDER_SITE_OTHER): Payer: Federal, State, Local not specified - PPO | Admitting: Advanced Practice Midwife

## 2013-06-15 ENCOUNTER — Encounter (HOSPITAL_COMMUNITY): Payer: Federal, State, Local not specified - PPO | Admitting: Psychiatry

## 2013-06-15 VITALS — BP 108/63

## 2013-06-15 DIAGNOSIS — D509 Iron deficiency anemia, unspecified: Secondary | ICD-10-CM

## 2013-06-15 DIAGNOSIS — O99019 Anemia complicating pregnancy, unspecified trimester: Secondary | ICD-10-CM

## 2013-06-15 DIAGNOSIS — F329 Major depressive disorder, single episode, unspecified: Secondary | ICD-10-CM

## 2013-06-15 DIAGNOSIS — F411 Generalized anxiety disorder: Secondary | ICD-10-CM

## 2013-06-15 LAB — POCT URINALYSIS DIP (DEVICE)
Bilirubin Urine: NEGATIVE
Glucose, UA: NEGATIVE mg/dL
Nitrite: NEGATIVE
Specific Gravity, Urine: 1.025 (ref 1.005–1.030)
pH: 7 (ref 5.0–8.0)

## 2013-06-15 MED ORDER — INTEGRA 62.5-62.5-40-3 MG PO CAPS: 1.0000 | ORAL_CAPSULE | Freq: Every day | ORAL | Status: DC

## 2013-06-15 MED ORDER — PRENATAL MULTIVITAMIN CH: 1.0000 | ORAL_TABLET | Freq: Every day | ORAL | Status: DC

## 2013-06-15 NOTE — Progress Notes (Signed)
Doing well.  Good fetal movement, denies vaginal bleeding, LOF, regular contractions.  Pt denies urinary symptoms.  Some intermittent back pain.  Rest/ice/heat/Tylenol/pregnancy support belt for back pain.  Feeling cold all the time, asked about her anemia. Taking PNV daily, has taken OTC iron supplement before but hasn't helped.  Integra 1 capsule daily plus prenatal vitamin.

## 2013-06-15 NOTE — Progress Notes (Signed)
Patient ID: Stacey Rhodes, female   DOB: Oct 26, 1995, 17 y.o.   MRN: [id number] Patient reviewed and interviewed today, states that her mood is fair she continues to feel sad about her situation. She is [redacted] weeks pregnant now and I discussed rationale risks benefits options off Prozac patient wants to think about it. She wants to nurse the baby and since Prozac is secreted in the breast milk she wants to consider that. Her sleep and appetite are good mood is better, states the groups are very helpful Denies suicidal or homicidal ideation no hallucinations or delusions. Patient is on no medications

## 2013-06-15 NOTE — Progress Notes (Signed)
P= 89 Needs PNV prescription.  Pt. States she has low iron according to the [facility] office and she is requesting iron pills.

## 2013-06-16 ENCOUNTER — Encounter (HOSPITAL_COMMUNITY): Payer: Federal, State, Local not specified - PPO

## 2013-06-16 NOTE — Progress Notes (Signed)
    Daily Group Progress Note  Program: IOP  Group Time: 9:00-10:30 am   Participation Level: Active  Behavioral Response: Appropriate  Type of Therapy:  Process Group  Summary of Progress: Pt participated in a holiday party within the group to practice having fun and healthy traditions and also talked about fears and ways to manage the Christmas holiday.        Group Time: 10:30 am - 12:00 pm   Participation Level:  Active  Behavioral Response: Appropriate  Type of Therapy: Psycho-education Group  Summary of Progress:  Pt learned about the DBT skill of Mindfulness and how to use it to manage stress and depression by being in the moment.    Carman Ching, LCSW

## 2013-06-18 ENCOUNTER — Encounter (HOSPITAL_COMMUNITY): Payer: Federal, State, Local not specified - PPO

## 2013-06-21 ENCOUNTER — Encounter (HOSPITAL_COMMUNITY): Payer: Federal, State, Local not specified - PPO | Admitting: Psychiatry

## 2013-06-21 DIAGNOSIS — F411 Generalized anxiety disorder: Secondary | ICD-10-CM

## 2013-06-21 DIAGNOSIS — F329 Major depressive disorder, single episode, unspecified: Secondary | ICD-10-CM

## 2013-06-21 NOTE — Progress Notes (Signed)
    Daily Group Progress Note  Program: IOP  Group Time: 9:00-10:30 am   Participation Level: Active  Behavioral Response: Appropriate  Type of Therapy:  Process Group  Summary of Progress: Pt worked on her goal of depression. Pt was dressed in dress clothes with hair and makeup styled, which was a significant change from last week where she was wearing sweat pants and no makeup with her hair pulled back.  Pt was more engaged in discussion today and said she feels "ok". Pt talked about how she feels "bilittled" in her home by her family who minimizes her depression symptoms. Pt said it frustrates her that she does not have more support from her family. Pt continues to benefit from the support and encouragement from the group members.      Group Time: 10:30 am - 12:00 pm   Participation Level:  Active  Behavioral Response: Appropriate  Type of Therapy: Psycho-education Group  Summary of Progress: Pt participated in a group with a focus on grief and loss and explored current losses impacting overall wellness and effective grieving strategies.   Carman Ching, LCSW

## 2013-06-22 ENCOUNTER — Encounter (HOSPITAL_COMMUNITY): Payer: Federal, State, Local not specified - PPO

## 2013-06-23 ENCOUNTER — Encounter (HOSPITAL_COMMUNITY): Payer: Federal, State, Local not specified - PPO | Admitting: Psychiatry

## 2013-06-23 DIAGNOSIS — F411 Generalized anxiety disorder: Secondary | ICD-10-CM

## 2013-06-23 DIAGNOSIS — F329 Major depressive disorder, single episode, unspecified: Secondary | ICD-10-CM

## 2013-06-23 NOTE — Progress Notes (Signed)
    Daily Group Progress Note  Program: IOP  Group Time: 9:00-10:30 am   Participation Level: Active  Behavioral Response: Appropriate  Type of Therapy:  Process Group  Summary of Progress: pt worked on her goal of depression today by talking about how she is learning to accept the lack of support by her family instead of trying to change them. Pt said the support she is receiving from the group is her primary reason for getting better. Pt reports improvement with depression symptoms.      Group Time: 10:30 am - 12:00 pm   Participation Level:  Active  Behavioral Response: Appropriate  Type of Therapy: Psycho-education Group  Summary of Progress: Pt learned about overcaring and how this affects anxiety and depression and how to set healthier limits with others.   Carman Ching, LCSW

## 2013-06-24 NOTE — L&D Delivery Note (Signed)
Operative Delivery Note At 12:19 AM a viable female was delivered via Vaginal, Spontaneous Delivery.  Presentation: vertex; Position: Left,, Occiput,, Anterior; Station: +3.  Delivery of the head: 08/31/2013 12:18 AM First maneuver: 08/31/2013 12:18 AM, McRoberts Second maneuver: 08/31/2013 12:18 AM, Suprapubic Pressure Third maneuver: 08/31/2013 12:19,  Delivery of posterior arm  Verbal consent: unable to obtain verbal consent due to emergent nature of delivery.  APGAR: 8, 9; weight .   Placenta status: Intact, Spontaneous.   Cord: 3 vessels with the following complications: None.  Cord pH: Not submitted  Anesthesia: Epidural  Episiotomy: None Lacerations: None Suture Repair: NA Est. Blood Loss (mL): 400  Mom to postpartum.  Baby to Couplet care / Skin to Skin.  Pt pushed for approximately 1 hr. I returned to check on patient, and found to have caput and perineum. Dr. Lolita LenzJoiner pushed with patient to delivery of head while pt was in modified sims on right side. Unable to deliver anterior shoulder at that time. I exchanged positions with Dr. Lolita LenzJoiner, Assistance from additional nurses and patient was placed in Mcroberts position with suprapubic pressure. Posterior hand presenting, and delivered. Remainder of infant followed with delivery of posterior arm. Infant immediately started crying and was placed on maternal abdomen. Father cut cord, Active management of third stage with pit and traction delivered intact placenta with 3v cord. No tears. EBL 400, hemostatic at completion. Counts correct.  This was a minor shoulder dystocia, and it would be reasonable for patient to trial labor with infant of similar size.   Stacey ScaleODOM, Stacey Rhodes 08/31/2013, 12:51 AM

## 2013-06-25 ENCOUNTER — Encounter (HOSPITAL_COMMUNITY): Payer: Federal, State, Local not specified - PPO | Attending: Psychiatry

## 2013-06-25 DIAGNOSIS — F3289 Other specified depressive episodes: Secondary | ICD-10-CM | POA: Insufficient documentation

## 2013-06-25 DIAGNOSIS — F411 Generalized anxiety disorder: Secondary | ICD-10-CM | POA: Insufficient documentation

## 2013-06-25 DIAGNOSIS — F329 Major depressive disorder, single episode, unspecified: Secondary | ICD-10-CM | POA: Insufficient documentation

## 2013-06-28 ENCOUNTER — Encounter (HOSPITAL_COMMUNITY): Payer: Federal, State, Local not specified - PPO

## 2013-06-29 ENCOUNTER — Encounter (HOSPITAL_COMMUNITY): Payer: Federal, State, Local not specified - PPO

## 2013-06-30 ENCOUNTER — Telehealth: Payer: Self-pay | Admitting: Obstetrics & Gynecology

## 2013-06-30 ENCOUNTER — Encounter (HOSPITAL_COMMUNITY): Payer: Federal, State, Local not specified - PPO

## 2013-06-30 NOTE — Telephone Encounter (Signed)
[facility] if [facility] fails to keep her [facility]t she will be discharged form their outpt program.  [facility].

## 2013-06-30 NOTE — Progress Notes (Unsigned)
Patient ID: Stacey FreesDebrisha L Rhodes, female   DOB: Oct 22, 1995, 18 y.o.   MRN: [id number] A:  Placed call to pt's OB-GYN (Dr. Willodean Rosenthalarolyn Harraway-Smith), to inform her that pt has been non-compliant with attendance.  Informed her that the plan is to discharge pt tomorrow if she doesn't attend [facility].  Also, mentioned that Dr. Rutherford Limerickadepalli had discussed starting pt on Prozac, but pt never returned to the program.  Dr. Erin FullingHarraway-Smith stated that pt has been compliant with the appointments with her.  States pt is due back to see her tomorrow and she was going to put a note in pt's chart that this writer had called about her being non-compliant with treatment.

## 2013-07-01 ENCOUNTER — Ambulatory Visit (INDEPENDENT_AMBULATORY_CARE_PROVIDER_SITE_OTHER): Payer: Federal, State, Local not specified - PPO | Admitting: Advanced Practice Midwife

## 2013-07-01 ENCOUNTER — Encounter (HOSPITAL_COMMUNITY): Payer: Federal, State, Local not specified - PPO

## 2013-07-01 VITALS — BP 110/71 | Temp 98.6°F | Wt 171.9 lb

## 2013-07-01 DIAGNOSIS — O99340 Other mental disorders complicating pregnancy, unspecified trimester: Secondary | ICD-10-CM

## 2013-07-01 DIAGNOSIS — F3289 Other specified depressive episodes: Secondary | ICD-10-CM

## 2013-07-01 DIAGNOSIS — F329 Major depressive disorder, single episode, unspecified: Secondary | ICD-10-CM

## 2013-07-01 DIAGNOSIS — F32A Depression, unspecified: Secondary | ICD-10-CM

## 2013-07-01 DIAGNOSIS — O2343 Unspecified infection of urinary tract in pregnancy, third trimester: Secondary | ICD-10-CM

## 2013-07-01 DIAGNOSIS — O2390 Unspecified genitourinary tract infection in pregnancy, unspecified trimester: Secondary | ICD-10-CM

## 2013-07-01 LAB — POCT URINALYSIS DIP (DEVICE)
BILIRUBIN URINE: NEGATIVE
Glucose, UA: NEGATIVE mg/dL
Hgb urine dipstick: NEGATIVE
Ketones, ur: NEGATIVE mg/dL
NITRITE: NEGATIVE
Protein, ur: NEGATIVE mg/dL
Specific Gravity, Urine: 1.02 (ref 1.005–1.030)
Urobilinogen, UA: 1 mg/dL (ref 0.0–1.0)
pH: 7.5 (ref 5.0–8.0)

## 2013-07-01 NOTE — Patient Instructions (Signed)
Follow up with OB-GYN and therapist of choice.  Encouraged support groups.

## 2013-07-01 NOTE — Patient Instructions (Signed)
Third Trimester of Pregnancy  The third trimester is from week 29 through week 42, months 7 through 9. The third trimester is a time when the fetus is growing rapidly. At the end of the ninth month, the fetus is about 20 inches in length and weighs 6 10 pounds.   BODY CHANGES  Your body goes through many changes during pregnancy. The changes vary from woman to woman.    Your weight will continue to increase. You can expect to gain 25 35 pounds (11 16 kg) by the end of the pregnancy.   You may begin to get stretch marks on your hips, abdomen, and breasts.   You may urinate more often because the fetus is moving lower into your pelvis and pressing on your bladder.   You may develop or continue to have heartburn as a result of your pregnancy.   You may develop constipation because certain hormones are causing the muscles that push waste through your intestines to slow down.   You may develop hemorrhoids or swollen, bulging veins (varicose veins).   You may have pelvic pain because of the weight gain and pregnancy hormones relaxing your joints between the bones in your pelvis. Back aches may result from over exertion of the muscles supporting your posture.   Your breasts will continue to grow and be tender. A yellow discharge may leak from your breasts called colostrum.   Your belly button may stick out.   You may feel short of breath because of your expanding uterus.   You may notice the fetus "dropping," or moving lower in your abdomen.   You may have a bloody mucus discharge. This usually occurs a few days to a week before labor begins.   Your cervix becomes thin and soft (effaced) near your due date.  WHAT TO EXPECT AT YOUR PRENATAL EXAMS   You will have prenatal exams every 2 weeks until week 36. Then, you will have weekly prenatal exams. During a routine prenatal visit:   You will be weighed to make sure you and the fetus are growing normally.   Your blood pressure is taken.   Your abdomen will be  measured to track your baby's growth.   The fetal heartbeat will be listened to.   Any test results from the previous visit will be discussed.   You may have a cervical check near your due date to see if you have effaced.  At around 36 weeks, your caregiver will check your cervix. At the same time, your caregiver will also perform a test on the secretions of the vaginal tissue. This test is to determine if a type of bacteria, Group B streptococcus, is present. Your caregiver will explain this further.  Your caregiver may ask you:   What your birth plan is.   How you are feeling.   If you are feeling the baby move.   If you have had any abnormal symptoms, such as leaking fluid, bleeding, severe headaches, or abdominal cramping.   If you have any questions.  Other tests or screenings that may be performed during your third trimester include:   Blood tests that check for low iron levels (anemia).   Fetal testing to check the health, activity level, and growth of the fetus. Testing is done if you have certain medical conditions or if there are problems during the pregnancy.  FALSE LABOR  You may feel small, irregular contractions that eventually go away. These are called Braxton Hicks contractions, or   false labor. Contractions may last for hours, days, or even weeks before true labor sets in. If contractions come at regular intervals, intensify, or become painful, it is best to be seen by your caregiver.   SIGNS OF LABOR    Menstrual-like cramps.   Contractions that are 5 minutes apart or less.   Contractions that start on the top of the uterus and spread down to the lower abdomen and back.   A sense of increased pelvic pressure or back pain.   A watery or bloody mucus discharge that comes from the vagina.  If you have any of these signs before the 37th week of pregnancy, call your caregiver right away. You need to go to the hospital to get checked immediately.  HOME CARE INSTRUCTIONS    Avoid all  smoking, herbs, alcohol, and unprescribed drugs. These chemicals affect the formation and growth of the baby.   Follow your caregiver's instructions regarding medicine use. There are medicines that are either safe or unsafe to take during pregnancy.   Exercise only as directed by your caregiver. Experiencing uterine cramps is a good sign to stop exercising.   Continue to eat regular, healthy meals.   Wear a good support bra for breast tenderness.   Do not use hot tubs, steam rooms, or saunas.   Wear your seat belt at all times when driving.   Avoid raw meat, uncooked cheese, cat litter boxes, and soil used by cats. These carry germs that can cause birth defects in the baby.   Take your prenatal vitamins.   Try taking a stool softener (if your caregiver approves) if you develop constipation. Eat more high-fiber foods, such as fresh vegetables or fruit and whole grains. Drink plenty of fluids to keep your urine clear or pale yellow.   Take warm sitz baths to soothe any pain or discomfort caused by hemorrhoids. Use hemorrhoid cream if your caregiver approves.   If you develop varicose veins, wear support hose. Elevate your feet for 15 minutes, 3 4 times a day. Limit salt in your diet.   Avoid heavy lifting, wear low heal shoes, and practice good posture.   Rest a lot with your legs elevated if you have leg cramps or low back pain.   Visit your dentist if you have not gone during your pregnancy. Use a soft toothbrush to brush your teeth and be gentle when you floss.   A sexual relationship may be continued unless your caregiver directs you otherwise.   Do not travel far distances unless it is absolutely necessary and only with the approval of your caregiver.   Take prenatal classes to understand, practice, and ask questions about the labor and delivery.   Make a trial run to the hospital.   Pack your hospital bag.   Prepare the baby's nursery.   Continue to go to all your prenatal visits as directed  by your caregiver.  SEEK MEDICAL CARE IF:   You are unsure if you are in labor or if your water has broken.   You have dizziness.   You have mild pelvic cramps, pelvic pressure, or nagging pain in your abdominal area.   You have persistent nausea, vomiting, or diarrhea.   You have a bad smelling vaginal discharge.   You have pain with urination.  SEEK IMMEDIATE MEDICAL CARE IF:    You have a fever.   You are leaking fluid from your vagina.   You have spotting or bleeding from your vagina.     You have severe abdominal cramping or pain.   You have rapid weight loss or gain.   You have shortness of breath with chest pain.   You notice sudden or extreme swelling of your face, hands, ankles, feet, or legs.   You have not felt your baby move in over an hour.   You have severe headaches that do not go away with medicine.   You have vision changes.  Document Released: 06/04/2001 Document Revised: 02/10/2013 Document Reviewed: 08/11/2012  [employer] Patient Information 2014 [employer]
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
## 2013-07-01 NOTE — Addendum Note (Signed)
Addended by: Franchot MimesALFARO, Ishaaq Penna on: 07/01/2013 03:36 PM   Modules accepted: Orders

## 2013-07-01 NOTE — Progress Notes (Signed)
Doing well. UCs not frequent. Reviewed signs of PTL. May want waterbirth. Discussed requirements. [facility]. States feels better. Refused Prozac.  Discussed is OK to use in pregnancy if she changes her mind. Boyfriend supportive.

## 2013-07-01 NOTE — Progress Notes (Signed)
Discharge Note  Patient:  Vick FreesDebrisha L Cottrill is an 18 y.o., female DOB:  May 30, 1996  Date of Admission:  06/14/13  Date of Discharge: 1/8./15  Reason for Admission: A 18 year old single American female 5428 weeks pregnant admitted for depression and anxiety.  Hospital Course: Patient started IOP and did not want to be on an antidepressant as she wanted to nurse her baby. Patient began attending groups and was able to except good feedback and was also able to express her fears and anxieties. Patient should listen major concern was the fact that her family did not approve of her being a single mother. She processed this very well. It appeared that she had become more depressed and I recommended that she take Prozac. He should did not want to do so. I encouraged her to discuss this with her OB/GYN. Then patient stopped coming to IOP despite repeated phone calls she did not return our calls and asks her that IOP protocol since she had missed more than 3 days she was discharged.  Mental Status at Discharge: Done on the phone patient was alert, oriented x3, mood was fair with anxiety she denied suicidal or homicidal ideation and had no hallucinations or delusions. Recent and remote memory was good, judgment and insight was good, concentration and recall were good.  Lab Results: No results found for this or any previous visit (from the past 48 hour(s)).  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 Asthma   . Seasonal allergies   . Depression    Axis IV: problems related to social environment and problems with primary support  group Axis V: 61-70 mild symptoms   Level of Care:  OP  Discharge destination:  Home  Is patient on multiple antipsychotic therapies at discharge:  No    Has Patient had three or more failed trials of antipsychotic monotherapy by history:  No  Patient phone:  [phone] (home)  Patient address:   [address] [place] [id number],   Follow-up recommendations:  Activity:  As tolerated Diet:  Regular Other:  Followup with her OB/GYN Dr. Sable Feilaroline Harrawayay Smith  Comments:  Dr. Katrinka BlazingSmith was updated regarding patient's condition.  The patient received suicide prevention pamphlet:  No   Margit Bandaadepalli, Allie Ousley 07/01/2013, 2:05 PM

## 2013-07-01 NOTE — Progress Notes (Signed)
Pulse- 93 Patient reports lower back pain & "braxton hicks contractions"

## 2013-07-02 ENCOUNTER — Encounter (HOSPITAL_COMMUNITY): Payer: Federal, State, Local not specified - PPO

## 2013-07-04 LAB — CULTURE, OB URINE: Colony Count: 95000

## 2013-07-05 ENCOUNTER — Other Ambulatory Visit: Payer: Self-pay | Admitting: Advanced Practice Midwife

## 2013-07-05 ENCOUNTER — Encounter (HOSPITAL_COMMUNITY): Payer: Federal, State, Local not specified - PPO

## 2013-07-05 MED ORDER — NITROFURANTOIN MONOHYD MACRO 100 MG PO CAPS: 100.0000 mg | ORAL_CAPSULE | Freq: Two times a day (BID) | ORAL | Status: DC

## 2013-07-06 ENCOUNTER — Encounter (HOSPITAL_COMMUNITY): Payer: Federal, State, Local not specified - PPO

## 2013-07-06 ENCOUNTER — Telehealth: Payer: Self-pay | Admitting: *Deleted

## 2013-07-06 MED ORDER — NITROFURANTOIN MONOHYD MACRO 100 MG PO CAPS: 100.0000 mg | ORAL_CAPSULE | Freq: Two times a day (BID) | ORAL | Status: AC

## 2013-07-06 NOTE — Telephone Encounter (Signed)
Called patient and left message with her mother for her to call the office for information.

## 2013-07-06 NOTE — Telephone Encounter (Signed)
Spoke with patient concerning UTI and medication is at her pharmacy.  Pt verbalized understanding.

## 2013-07-06 NOTE — Telephone Encounter (Signed)
eprescribed medication:  Still need to inform patient.

## 2013-07-07 ENCOUNTER — Encounter (HOSPITAL_COMMUNITY): Payer: Federal, State, Local not specified - PPO

## 2013-07-08 ENCOUNTER — Encounter (HOSPITAL_COMMUNITY): Payer: Federal, State, Local not specified - PPO

## 2013-07-09 ENCOUNTER — Encounter (HOSPITAL_COMMUNITY): Payer: Federal, State, Local not specified - PPO

## 2013-07-12 ENCOUNTER — Encounter (HOSPITAL_COMMUNITY): Payer: Federal, State, Local not specified - PPO

## 2013-07-13 ENCOUNTER — Encounter (HOSPITAL_COMMUNITY): Payer: Federal, State, Local not specified - PPO

## 2013-07-14 ENCOUNTER — Encounter: Payer: Self-pay | Admitting: Obstetrics and Gynecology

## 2013-07-14 ENCOUNTER — Ambulatory Visit (INDEPENDENT_AMBULATORY_CARE_PROVIDER_SITE_OTHER): Payer: Federal, State, Local not specified - PPO | Admitting: Obstetrics and Gynecology

## 2013-07-14 ENCOUNTER — Encounter (HOSPITAL_COMMUNITY): Payer: Federal, State, Local not specified - PPO

## 2013-07-14 VITALS — BP 95/58 | Temp 97.6°F | Wt 174.2 lb

## 2013-07-14 DIAGNOSIS — O0930 Supervision of pregnancy with insufficient antenatal care, unspecified trimester: Secondary | ICD-10-CM

## 2013-07-14 LAB — POCT URINALYSIS DIP (DEVICE)
BILIRUBIN URINE: NEGATIVE
Glucose, UA: NEGATIVE mg/dL
Hgb urine dipstick: NEGATIVE
KETONES UR: NEGATIVE mg/dL
Nitrite: NEGATIVE
Protein, ur: 30 mg/dL — AB
Specific Gravity, Urine: 1.025 (ref 1.005–1.030)
Urobilinogen, UA: 2 mg/dL — ABNORMAL HIGH (ref 0.0–1.0)
pH: 7 (ref 5.0–8.0)

## 2013-07-14 NOTE — Patient Instructions (Signed)
Third Trimester of Pregnancy  The third trimester is from week 29 through week 42, months 7 through 9. The third trimester is a time when the fetus is growing rapidly. At the end of the ninth month, the fetus is about 20 inches in length and weighs 6 10 pounds.   BODY CHANGES  Your body goes through many changes during pregnancy. The changes vary from woman to woman.    Your weight will continue to increase. You can expect to gain 25 35 pounds (11 16 kg) by the end of the pregnancy.   You may begin to get stretch marks on your hips, abdomen, and breasts.   You may urinate more often because the fetus is moving lower into your pelvis and pressing on your bladder.   You may develop or continue to have heartburn as a result of your pregnancy.   You may develop constipation because certain hormones are causing the muscles that push waste through your intestines to slow down.   You may develop hemorrhoids or swollen, bulging veins (varicose veins).   You may have pelvic pain because of the weight gain and pregnancy hormones relaxing your joints between the bones in your pelvis. Back aches may result from over exertion of the muscles supporting your posture.   Your breasts will continue to grow and be tender. A yellow discharge may leak from your breasts called colostrum.   Your belly button may stick out.   You may feel short of breath because of your expanding uterus.   You may notice the fetus "dropping," or moving lower in your abdomen.   You may have a bloody mucus discharge. This usually occurs a few days to a week before labor begins.   Your cervix becomes thin and soft (effaced) near your due date.  WHAT TO EXPECT AT YOUR PRENATAL EXAMS   You will have prenatal exams every 2 weeks until week 36. Then, you will have weekly prenatal exams. During a routine prenatal visit:   You will be weighed to make sure you and the fetus are growing normally.   Your blood pressure is taken.   Your abdomen will be  measured to track your baby's growth.   The fetal heartbeat will be listened to.   Any test results from the previous visit will be discussed.   You may have a cervical check near your due date to see if you have effaced.  At around 36 weeks, your caregiver will check your cervix. At the same time, your caregiver will also perform a test on the secretions of the vaginal tissue. This test is to determine if a type of bacteria, Group B streptococcus, is present. Your caregiver will explain this further.  Your caregiver may ask you:   What your birth plan is.   How you are feeling.   If you are feeling the baby move.   If you have had any abnormal symptoms, such as leaking fluid, bleeding, severe headaches, or abdominal cramping.   If you have any questions.  Other tests or screenings that may be performed during your third trimester include:   Blood tests that check for low iron levels (anemia).   Fetal testing to check the health, activity level, and growth of the fetus. Testing is done if you have certain medical conditions or if there are problems during the pregnancy.  FALSE LABOR  You may feel small, irregular contractions that eventually go away. These are called Braxton Hicks contractions, or   false labor. Contractions may last for hours, days, or even weeks before true labor sets in. If contractions come at regular intervals, intensify, or become painful, it is best to be seen by your caregiver.   SIGNS OF LABOR    Menstrual-like cramps.   Contractions that are 5 minutes apart or less.   Contractions that start on the top of the uterus and spread down to the lower abdomen and back.   A sense of increased pelvic pressure or back pain.   A watery or bloody mucus discharge that comes from the vagina.  If you have any of these signs before the 37th week of pregnancy, call your caregiver right away. You need to go to the hospital to get checked immediately.  HOME CARE INSTRUCTIONS    Avoid all  smoking, herbs, alcohol, and unprescribed drugs. These chemicals affect the formation and growth of the baby.   Follow your caregiver's instructions regarding medicine use. There are medicines that are either safe or unsafe to take during pregnancy.   Exercise only as directed by your caregiver. Experiencing uterine cramps is a good sign to stop exercising.   Continue to eat regular, healthy meals.   Wear a good support bra for breast tenderness.   Do not use hot tubs, steam rooms, or saunas.   Wear your seat belt at all times when driving.   Avoid raw meat, uncooked cheese, cat litter boxes, and soil used by cats. These carry germs that can cause birth defects in the baby.   Take your prenatal vitamins.   Try taking a stool softener (if your caregiver approves) if you develop constipation. Eat more high-fiber foods, such as fresh vegetables or fruit and whole grains. Drink plenty of fluids to keep your urine clear or pale yellow.   Take warm sitz baths to soothe any pain or discomfort caused by hemorrhoids. Use hemorrhoid cream if your caregiver approves.   If you develop varicose veins, wear support hose. Elevate your feet for 15 minutes, 3 4 times a day. Limit salt in your diet.   Avoid heavy lifting, wear low heal shoes, and practice good posture.   Rest a lot with your legs elevated if you have leg cramps or low back pain.   Visit your dentist if you have not gone during your pregnancy. Use a soft toothbrush to brush your teeth and be gentle when you floss.   A sexual relationship may be continued unless your caregiver directs you otherwise.   Do not travel far distances unless it is absolutely necessary and only with the approval of your caregiver.   Take prenatal classes to understand, practice, and ask questions about the labor and delivery.   Make a trial run to the hospital.   Pack your hospital bag.   Prepare the baby's nursery.   Continue to go to all your prenatal visits as directed  by your caregiver.  SEEK MEDICAL CARE IF:   You are unsure if you are in labor or if your water has broken.   You have dizziness.   You have mild pelvic cramps, pelvic pressure, or nagging pain in your abdominal area.   You have persistent nausea, vomiting, or diarrhea.   You have a bad smelling vaginal discharge.   You have pain with urination.  SEEK IMMEDIATE MEDICAL CARE IF:    You have a fever.   You are leaking fluid from your vagina.   You have spotting or bleeding from your vagina.     You have severe abdominal cramping or pain.   You have rapid weight loss or gain.   You have shortness of breath with chest pain.   You notice sudden or extreme swelling of your face, hands, ankles, feet, or legs.   You have not felt your baby move in over an hour.   You have severe headaches that do not go away with medicine.   You have vision changes.  Document Released: 06/04/2001 Document Revised: 02/10/2013 Document Reviewed: 08/11/2012  [employer] Patient Information 2014 [employer]

## 2013-07-14 NOTE — Progress Notes (Signed)
P=93 Pt reports pressure in vaginal area when baby moves.

## 2013-07-14 NOTE — Progress Notes (Signed)
Proteinuria. Urine C&S 1/8> staph. Will pick up Rx,  no UTI sx.  Discussed s/sx labor, classes, plans.

## 2013-07-19 ENCOUNTER — Ambulatory Visit (HOSPITAL_COMMUNITY): Payer: Self-pay | Admitting: Psychiatry

## 2013-07-27 ENCOUNTER — Ambulatory Visit (INDEPENDENT_AMBULATORY_CARE_PROVIDER_SITE_OTHER): Payer: Federal, State, Local not specified - PPO | Admitting: Obstetrics and Gynecology

## 2013-07-27 VITALS — BP 107/64 | Temp 98.4°F | Wt 177.4 lb

## 2013-07-27 DIAGNOSIS — IMO0002 Reserved for concepts with insufficient information to code with codable children: Secondary | ICD-10-CM

## 2013-07-27 DIAGNOSIS — O9989 Other specified diseases and conditions complicating pregnancy, childbirth and the puerperium: Secondary | ICD-10-CM

## 2013-07-27 DIAGNOSIS — O0930 Supervision of pregnancy with insufficient antenatal care, unspecified trimester: Secondary | ICD-10-CM

## 2013-07-27 DIAGNOSIS — Z22330 Carrier of Group B streptococcus: Secondary | ICD-10-CM

## 2013-07-27 DIAGNOSIS — O0932 Supervision of pregnancy with insufficient antenatal care, second trimester: Secondary | ICD-10-CM

## 2013-07-27 DIAGNOSIS — Z3400 Encounter for supervision of normal first pregnancy, unspecified trimester: Secondary | ICD-10-CM

## 2013-07-27 DIAGNOSIS — O99820 Streptococcus B carrier state complicating pregnancy: Secondary | ICD-10-CM

## 2013-07-27 DIAGNOSIS — Z23 Encounter for immunization: Secondary | ICD-10-CM

## 2013-07-27 LAB — POCT URINALYSIS DIP (DEVICE)
Bilirubin Urine: NEGATIVE
GLUCOSE, UA: NEGATIVE mg/dL
Hgb urine dipstick: NEGATIVE
KETONES UR: NEGATIVE mg/dL
Nitrite: NEGATIVE
Protein, ur: NEGATIVE mg/dL
SPECIFIC GRAVITY, URINE: 1.02 (ref 1.005–1.030)
Urobilinogen, UA: 2 mg/dL — ABNORMAL HIGH (ref 0.0–1.0)
pH: 7.5 (ref 5.0–8.0)

## 2013-07-27 LAB — OB RESULTS CONSOLE GC/CHLAMYDIA
CHLAMYDIA, DNA PROBE: NEGATIVE
Gonorrhea: NEGATIVE

## 2013-07-27 LAB — OB RESULTS CONSOLE GBS: GBS: POSITIVE

## 2013-07-27 MED ORDER — TETANUS-DIPHTH-ACELL PERTUSSIS 5-2.5-18.5 LF-MCG/0.5 IM SUSP: 0.5000 mL | Freq: Once | INTRAMUSCULAR | Status: DC

## 2013-07-27 NOTE — Patient Instructions (Signed)
Third Trimester of Pregnancy  The third trimester is from week 29 through week 42, months 7 through 9. The third trimester is a time when the fetus is growing rapidly. At the end of the ninth month, the fetus is about 20 inches in length and weighs 6 10 pounds.   BODY CHANGES  Your body goes through many changes during pregnancy. The changes vary from woman to woman.    Your weight will continue to increase. You can expect to gain 25 35 pounds (11 16 kg) by the end of the pregnancy.   You may begin to get stretch marks on your hips, abdomen, and breasts.   You may urinate more often because the fetus is moving lower into your pelvis and pressing on your bladder.   You may develop or continue to have heartburn as a result of your pregnancy.   You may develop constipation because certain hormones are causing the muscles that push waste through your intestines to slow down.   You may develop hemorrhoids or swollen, bulging veins (varicose veins).   You may have pelvic pain because of the weight gain and pregnancy hormones relaxing your joints between the bones in your pelvis. Back aches may result from over exertion of the muscles supporting your posture.   Your breasts will continue to grow and be tender. A yellow discharge may leak from your breasts called colostrum.   Your belly button may stick out.   You may feel short of breath because of your expanding uterus.   You may notice the fetus "dropping," or moving lower in your abdomen.   You may have a bloody mucus discharge. This usually occurs a few days to a week before labor begins.   Your cervix becomes thin and soft (effaced) near your due date.  WHAT TO EXPECT AT YOUR PRENATAL EXAMS   You will have prenatal exams every 2 weeks until week 36. Then, you will have weekly prenatal exams. During a routine prenatal visit:   You will be weighed to make sure you and the fetus are growing normally.   Your blood pressure is taken.   Your abdomen will be  measured to track your baby's growth.   The fetal heartbeat will be listened to.   Any test results from the previous visit will be discussed.   You may have a cervical check near your due date to see if you have effaced.  At around 36 weeks, your caregiver will check your cervix. At the same time, your caregiver will also perform a test on the secretions of the vaginal tissue. This test is to determine if a type of bacteria, Group B streptococcus, is present. Your caregiver will explain this further.  Your caregiver may ask you:   What your birth plan is.   How you are feeling.   If you are feeling the baby move.   If you have had any abnormal symptoms, such as leaking fluid, bleeding, severe headaches, or abdominal cramping.   If you have any questions.  Other tests or screenings that may be performed during your third trimester include:   Blood tests that check for low iron levels (anemia).   Fetal testing to check the health, activity level, and growth of the fetus. Testing is done if you have certain medical conditions or if there are problems during the pregnancy.  FALSE LABOR  You may feel small, irregular contractions that eventually go away. These are called Braxton Hicks contractions, or   false labor. Contractions may last for hours, days, or even weeks before true labor sets in. If contractions come at regular intervals, intensify, or become painful, it is best to be seen by your caregiver.   SIGNS OF LABOR    Menstrual-like cramps.   Contractions that are 5 minutes apart or less.   Contractions that start on the top of the uterus and spread down to the lower abdomen and back.   A sense of increased pelvic pressure or back pain.   A watery or bloody mucus discharge that comes from the vagina.  If you have any of these signs before the 37th week of pregnancy, call your caregiver right away. You need to go to the hospital to get checked immediately.  HOME CARE INSTRUCTIONS    Avoid all  smoking, herbs, alcohol, and unprescribed drugs. These chemicals affect the formation and growth of the baby.   Follow your caregiver's instructions regarding medicine use. There are medicines that are either safe or unsafe to take during pregnancy.   Exercise only as directed by your caregiver. Experiencing uterine cramps is a good sign to stop exercising.   Continue to eat regular, healthy meals.   Wear a good support bra for breast tenderness.   Do not use hot tubs, steam rooms, or saunas.   Wear your seat belt at all times when driving.   Avoid raw meat, uncooked cheese, cat litter boxes, and soil used by cats. These carry germs that can cause birth defects in the baby.   Take your prenatal vitamins.   Try taking a stool softener (if your caregiver approves) if you develop constipation. Eat more high-fiber foods, such as fresh vegetables or fruit and whole grains. Drink plenty of fluids to keep your urine clear or pale yellow.   Take warm sitz baths to soothe any pain or discomfort caused by hemorrhoids. Use hemorrhoid cream if your caregiver approves.   If you develop varicose veins, wear support hose. Elevate your feet for 15 minutes, 3 4 times a day. Limit salt in your diet.   Avoid heavy lifting, wear low heal shoes, and practice good posture.   Rest a lot with your legs elevated if you have leg cramps or low back pain.   Visit your dentist if you have not gone during your pregnancy. Use a soft toothbrush to brush your teeth and be gentle when you floss.   A sexual relationship may be continued unless your caregiver directs you otherwise.   Do not travel far distances unless it is absolutely necessary and only with the approval of your caregiver.   Take prenatal classes to understand, practice, and ask questions about the labor and delivery.   Make a trial run to the hospital.   Pack your hospital bag.   Prepare the baby's nursery.   Continue to go to all your prenatal visits as directed  by your caregiver.  SEEK MEDICAL CARE IF:   You are unsure if you are in labor or if your water has broken.   You have dizziness.   You have mild pelvic cramps, pelvic pressure, or nagging pain in your abdominal area.   You have persistent nausea, vomiting, or diarrhea.   You have a bad smelling vaginal discharge.   You have pain with urination.  SEEK IMMEDIATE MEDICAL CARE IF:    You have a fever.   You are leaking fluid from your vagina.   You have spotting or bleeding from your vagina.     You have severe abdominal cramping or pain.   You have rapid weight loss or gain.   You have shortness of breath with chest pain.   You notice sudden or extreme swelling of your face, hands, ankles, feet, or legs.   You have not felt your baby move in over an hour.   You have severe headaches that do not go away with medicine.   You have vision changes.  Document Released: 06/04/2001 Document Revised: 02/10/2013 Document Reviewed: 08/11/2012  [employer] Patient Information 2014 [employer]

## 2013-07-27 NOTE — Progress Notes (Signed)
Treated ASB. GC/CT, GBS obtained

## 2013-07-27 NOTE — Progress Notes (Signed)
Doing well. Took abx for ASB. GC/CT, GBS done. No sx depression. Reviewed plans.

## 2013-07-28 ENCOUNTER — Encounter: Payer: Self-pay | Admitting: Obstetrics and Gynecology

## 2013-07-28 LAB — GC/CHLAMYDIA PROBE AMP
CT PROBE, AMP APTIMA: NEGATIVE
GC Probe RNA: NEGATIVE

## 2013-07-29 LAB — CULTURE, BETA STREP (GROUP B ONLY)

## 2013-08-02 DIAGNOSIS — O99820 Streptococcus B carrier state complicating pregnancy: Secondary | ICD-10-CM | POA: Insufficient documentation

## 2013-08-04 ENCOUNTER — Inpatient Hospital Stay (HOSPITAL_COMMUNITY)
Admission: AD | Admit: 2013-08-04 | Discharge: 2013-08-04 | Disposition: A | Discharge status: Home / Self Care | Payer: Federal, State, Local not specified - PPO | Source: Ambulatory Visit | Attending: Obstetrics & Gynecology | Admitting: Obstetrics & Gynecology

## 2013-08-04 ENCOUNTER — Encounter (HOSPITAL_COMMUNITY): Payer: Self-pay | Admitting: *Deleted

## 2013-08-04 DIAGNOSIS — O9A213 Injury, poisoning and certain other consequences of external causes complicating pregnancy, third trimester: Secondary | ICD-10-CM

## 2013-08-04 DIAGNOSIS — O9989 Other specified diseases and conditions complicating pregnancy, childbirth and the puerperium: Principal | ICD-10-CM

## 2013-08-04 DIAGNOSIS — O2390 Unspecified genitourinary tract infection in pregnancy, unspecified trimester: Secondary | ICD-10-CM | POA: Insufficient documentation

## 2013-08-04 DIAGNOSIS — W108XXA Fall (on) (from) other stairs and steps, initial encounter: Secondary | ICD-10-CM | POA: Insufficient documentation

## 2013-08-04 DIAGNOSIS — B3731 Acute candidiasis of vulva and vagina: Secondary | ICD-10-CM | POA: Insufficient documentation

## 2013-08-04 DIAGNOSIS — R1084 Generalized abdominal pain: Secondary | ICD-10-CM

## 2013-08-04 DIAGNOSIS — W010XXA Fall on same level from slipping, tripping and stumbling without subsequent striking against object, initial encounter: Secondary | ICD-10-CM

## 2013-08-04 DIAGNOSIS — O99891 Other specified diseases and conditions complicating pregnancy: Secondary | ICD-10-CM | POA: Insufficient documentation

## 2013-08-04 DIAGNOSIS — B373 Candidiasis of vulva and vagina: Secondary | ICD-10-CM | POA: Insufficient documentation

## 2013-08-04 DIAGNOSIS — O479 False labor, unspecified: Secondary | ICD-10-CM | POA: Insufficient documentation

## 2013-08-04 DIAGNOSIS — Y92009 Unspecified place in unspecified non-institutional (private) residence as the place of occurrence of the external cause: Secondary | ICD-10-CM | POA: Insufficient documentation

## 2013-08-04 LAB — WET PREP, GENITAL
CLUE CELLS WET PREP: NONE SEEN
Trich, Wet Prep: NONE SEEN
YEAST WET PREP: NONE SEEN

## 2013-08-04 LAB — URINALYSIS, ROUTINE W REFLEX MICROSCOPIC
Bilirubin Urine: NEGATIVE
Glucose, UA: NEGATIVE mg/dL
HGB URINE DIPSTICK: NEGATIVE
Ketones, ur: NEGATIVE mg/dL
Nitrite: NEGATIVE
PROTEIN: NEGATIVE mg/dL
Specific Gravity, Urine: 1.02 (ref 1.005–1.030)
UROBILINOGEN UA: 0.2 mg/dL (ref 0.0–1.0)
pH: 7 (ref 5.0–8.0)

## 2013-08-04 LAB — URINE MICROSCOPIC-ADD ON

## 2013-08-04 LAB — POCT FERN TEST: POCT FERN TEST: NEGATIVE

## 2013-08-04 MED ORDER — FLUCONAZOLE 150 MG PO TABS
150.0000 mg | ORAL_TABLET | Freq: Once | ORAL | Status: AC
  Administered 2013-08-04: 150 mg via ORAL
  Filled 2013-08-04: qty 1

## 2013-08-04 NOTE — Discharge Instructions (Signed)
Injuries In Pregnancy °Trauma is the most common cause of injury and death in pregnant women. The most common cause of death to the fetus is injury and death of the pregnant mother.  °Minor falls and minor automobile accidents do not usually harm the fetus. The fetus is protected in the womb by a sac filled with fluid. The fetus can be harmed if there is direct trauma to your abdomen and pelvis. Direct trauma to the uterus and placenta can affect the blood supply to the fetus. Major trauma causing significant bleeding and shock to the mother can also compromise and jeopardize the fetal blood supply. °It is important to know your blood type and the father's blood type in case you develop vaginal bleeding. If you are RH negative and have sustained serious trauma or develop vaginal bleeding, you will need to have medicine (RhoGAM [Rh immune globulin]) to avoid Rh problems in future pregnancies. °CAUSES °· Falls are more common in the second and third trimester of the pregnancy. Factors that increase your risk of falling include: °· Increase in weight. °· The change of your center of gravity. °· Tripping over an object that cannot be seen. °· Automobile accidents. It is important to wear a seat belt and always practice safe driving. °· Domestic violence or assault. Dial your local emergency services (911 in the [place]). Spousal abuse can be a significant cause of trauma during pregnancy. °· Burns (fire or electrical). Avoid fires, starting fires, lifting heavy pots of boiling or hot liquids, and fixing electrical problems. °The most common causes of death to the pregnant woman include: °· Injuries that cause severe bleeding, shock and loss of blood flow to the mother's major organs. °· Head and neck injuries that result in severe brain or spinal damage. °· Chest trauma that can cause direct injury to the heart and lungs or any injury that effects the area enclosed by the ribs (thorax). Trauma to this area can result in  cardio-respiratory arrest. °Symptoms and treatment will depend on the type of injury. °HOME CARE INSTRUCTIONS  °· Call your caregiver if you are in a car accident, even if you think you and the baby are not hurt. Your caregiver may want you to have a precautionary evaluation. °· Do not take aspirin. It can worsen bleeding. °· You may apply cold packs 3 to 4 times a day to the injury with your caregiver's permission. °· After 24 hours apply warm compresses to the injured site with your caregiver's permission. °· Call your caregiver if you are having increasing pain in any part of your body that is not remedied by your instructed home care. °· In a severe injury, try to have someone be with you and help you until you are able to take care of yourself. °· Do not wear high heel shoes while pregnant. °· Remove slippery rugs and loose objects on the floor. °SEEK IMMEDIATE MEDICAL CARE IF:  °· You have been assaulted (domestic or otherwise). °· You have been in a car accident. °· You develop vaginal bleeding. °· You develop fluid leaking from the vagina. °· You develop uterine contractions (pelvic cramping or pain). °· You develop neck stiffness or pain. °· You become weak or faint, or have uncontrolled vomiting after trauma. °· You had a serious burn. This includes burns to the face, neck, hands or genitals, or burns greater than the size of your palm anywhere else. °· You develop a headache or vision problems after a fall or from   other trauma.  You do not feel the baby moving or the baby is not moving as much as before. Document Released: 07/18/2004 Document Revised: 09/02/2011 Document Reviewed: 03/17/2013 [facility] Patient Information 2014 [place], [place].  Fetal Movement Counts Patient Name: __________________________________________________ Patient Due Date: ____________________ Performing a fetal movement count is highly recommended in high-risk pregnancies, but it is good for every pregnant woman to do. Your  caregiver may ask you to start counting fetal movements at 28 weeks of the pregnancy. Fetal movements often increase:  After eating a full meal.  After physical activity.  After eating or drinking something sweet or cold.  At rest. Pay attention to when you feel the baby is most active. This will help you notice a pattern of your baby's sleep and wake cycles and what factors contribute to an increase in fetal movement. It is important to perform a fetal movement count at the same time each day when your baby is normally most active.  HOW TO COUNT FETAL MOVEMENTS 1. Find a quiet and comfortable area to sit or lie down on your left side. Lying on your left side provides the best blood and oxygen circulation to your baby. 2. Write down the day and time on a sheet of paper or in a journal. 3. Start counting kicks, flutters, swishes, rolls, or jabs in a 2 hour period. You should feel at least 10 movements within 2 hours. 4. If you do not feel 10 movements in 2 hours, wait 2 3 hours and count again. Look for a change in the pattern or not enough counts in 2 hours. SEEK MEDICAL CARE IF:  You feel less than 10 counts in 2 hours, tried twice.  There is no movement in over an hour.  The pattern is changing or taking longer each day to reach 10 counts in 2 hours.  You feel the baby is not moving as he or she usually does. Date: ____________ Movements: ____________ Start time: ____________ Doreatha Martin time: ____________  Date: ____________ Movements: ____________ Start time: ____________ Doreatha Martin time: ____________ Date: ____________ Movements: ____________ Start time: ____________ Doreatha Martin time: ____________ Date: ____________ Movements: ____________ Start time: ____________ Doreatha Martin time: ____________ Date: ____________ Movements: ____________ Start time: ____________ Doreatha Martin time: ____________ Date: ____________ Movements: ____________ Start time: ____________ Doreatha Martin time: ____________ Date: ____________  Movements: ____________ Start time: ____________ Doreatha Martin time: ____________ Date: ____________ Movements: ____________ Start time: ____________ Doreatha Martin time: ____________  Date: ____________ Movements: ____________ Start time: ____________ Doreatha Martin time: ____________ Date: ____________ Movements: ____________ Start time: ____________ Doreatha Martin time: ____________ Date: ____________ Movements: ____________ Start time: ____________ Doreatha Martin time: ____________ Date: ____________ Movements: ____________ Start time: ____________ Doreatha Martin time: ____________ Date: ____________ Movements: ____________ Start time: ____________ Doreatha Martin time: ____________ Date: ____________ Movements: ____________ Start time: ____________ Doreatha Martin time: ____________ Date: ____________ Movements: ____________ Start time: ____________ Doreatha Martin time: ____________  Date: ____________ Movements: ____________ Start time: ____________ Doreatha Martin time: ____________ Date: ____________ Movements: ____________ Start time: ____________ Doreatha Martin time: ____________ Date: ____________ Movements: ____________ Start time: ____________ Doreatha Martin time: ____________ Date: ____________ Movements: ____________ Start time: ____________ Doreatha Martin time: ____________ Date: ____________ Movements: ____________ Start time: ____________ Doreatha Martin time: ____________ Date: ____________ Movements: ____________ Start time: ____________ Doreatha Martin time: ____________ Date: ____________ Movements: ____________ Start time: ____________ Doreatha Martin time: ____________  Date: ____________ Movements: ____________ Start time: ____________ Doreatha Martin time: ____________ Date: ____________ Movements: ____________ Start time: ____________ Doreatha Martin time: ____________ Date: ____________ Movements: ____________ Start time: ____________ Doreatha Martin time: ____________ Date: ____________ Movements: ____________ Start time: ____________ Doreatha Martin time: ____________ Date: ____________ Movements:  ____________ Start time:  ____________ Doreatha MartinFinish time: ____________ Date: ____________ Movements: ____________ Start time: ____________ Doreatha MartinFinish time: ____________ Date: ____________ Movements: ____________ Start time: ____________ Doreatha MartinFinish time: ____________  Date: ____________ Movements: ____________ Start time: ____________ Doreatha MartinFinish time: ____________ Date: ____________ Movements: ____________ Start time: ____________ Doreatha MartinFinish time: ____________ Date: ____________ Movements: ____________ Start time: ____________ Doreatha MartinFinish time: ____________ Date: ____________ Movements: ____________ Start time: ____________ Doreatha MartinFinish time: ____________ Date: ____________ Movements: ____________ Start time: ____________ Doreatha MartinFinish time: ____________ Date: ____________ Movements: ____________ Start time: ____________ Doreatha MartinFinish time: ____________ Date: ____________ Movements: ____________ Start time: ____________ Doreatha MartinFinish time: ____________  Date: ____________ Movements: ____________ Start time: ____________ Doreatha MartinFinish time: ____________ Date: ____________ Movements: ____________ Start time: ____________ Doreatha MartinFinish time: ____________ Date: ____________ Movements: ____________ Start time: ____________ Doreatha MartinFinish time: ____________ Date: ____________ Movements: ____________ Start time: ____________ Doreatha MartinFinish time: ____________ Date: ____________ Movements: ____________ Start time: ____________ Doreatha MartinFinish time: ____________ Date: ____________ Movements: ____________ Start time: ____________ Doreatha MartinFinish time: ____________ Date: ____________ Movements: ____________ Start time: ____________ Doreatha MartinFinish time: ____________  Date: ____________ Movements: ____________ Start time: ____________ Doreatha MartinFinish time: ____________ Date: ____________ Movements: ____________ Start time: ____________ Doreatha MartinFinish time: ____________ Date: ____________ Movements: ____________ Start time: ____________ Doreatha MartinFinish time: ____________ Date: ____________ Movements: ____________ Start time: ____________ Doreatha MartinFinish time: ____________ Date:  ____________ Movements: ____________ Start time: ____________ Doreatha MartinFinish time: ____________ Date: ____________ Movements: ____________ Start time: ____________ Doreatha MartinFinish time: ____________ Date: ____________ Movements: ____________ Start time: ____________ Doreatha MartinFinish time: ____________  Date: ____________ Movements: ____________ Start time: ____________ Doreatha MartinFinish time: ____________ Date: ____________ Movements: ____________ Start time: ____________ Doreatha MartinFinish time: ____________ Date: ____________ Movements: ____________ Start time: ____________ Doreatha MartinFinish time: ____________ Date: ____________ Movements: ____________ Start time: ____________ Doreatha MartinFinish time: ____________ Date: ____________ Movements: ____________ Start time: ____________ Doreatha MartinFinish time: ____________ Date: ____________ Movements: ____________ Start time: ____________ Doreatha MartinFinish time: ____________ Document Released: 07/10/2006 Document Revised: 05/27/2012 Document Reviewed: 04/06/2012 [employer] Patient Information 2014 [employer].

## 2013-08-04 NOTE — MAU Note (Signed)
Patient states she fell down on her left side of her abdomen and hip at about 1500. States it was outside steps and 2 steps from the bottom. States she is having pain on the left side that is constant. Denies bleeding or leaking and reports good fetal movement.

## 2013-08-04 NOTE — MAU Provider Note (Signed)
History     CSN: [id number]  Arrival date and time: 08/04/13 1717   None     Chief Complaint  Patient presents with  . Fall  . Abdominal Pain   HPI Ms. Stacey Rhodes is a 18 y.o. G1P0000 at 4831w2d who presents to MAU today after a fall down 2 stairs this afternoon. She states that she landed on her left hip and left side of her abdomen. She fell on concrete stairs. She denies vaginal bleeding. She states possible LOF prior to fall, none since. She states occasional irregular contractions today both prior to and after the fall. She reports good fetal movement. She denies other injury or head trauma. No LOC.   OB History   Grav Para Term Preterm Abortions TAB SAB Ect Mult Living   1 0 0 0 0 0 0 0 0 0       Past Medical History  Diagnosis Date  . Asthma   . Seasonal allergies   . Depression     Past Surgical History  Procedure Laterality Date  . No past surgeries      Family History  Problem Relation Age of Onset  . Cancer Maternal Grandmother     History  Substance Use Topics  . Smoking status: Never Smoker   . Smokeless tobacco: Never Used  . Alcohol Use: No    Allergies: No Known Allergies  Facility-administered medications prior to admission  Medication Dose Route Frequency Provider Last Rate Last Dose  . Tdap (BOOSTRIX) injection 0.5 mL  0.5 mL Intramuscular Once Danae Orleanseirdre C Poe, CNM       Prescriptions prior to admission  Medication Sig Dispense Refill  . albuterol (PROVENTIL HFA;VENTOLIN HFA) 108 (90 BASE) MCG/ACT inhaler Inhale into the lungs every 6 (six) hours as needed for wheezing or shortness of breath.      . Fe Fum-FePoly-Vit C-Vit B3 (INTEGRA) 62.5-62.5-40-3 MG CAPS Take 1 capsule by mouth daily.  30 capsule  5  . nitrofurantoin, macrocrystal-monohydrate, (MACROBID) 100 MG capsule Take 100 mg by mouth 2 (two) times daily.      . Prenatal Vit-Fe Fumarate-FA (PRENATAL MULTIVITAMIN) TABS tablet Take 1 tablet by mouth daily at 12 noon.  30 tablet   10    Review of Systems  Constitutional: Negative for fever.  Gastrointestinal: Positive for abdominal pain.  Genitourinary:       Neg - vaginal bleeding + LOF  Neurological: Negative for headaches.   Physical Exam   Blood pressure 112/66, pulse 101, temperature 98.8 F (37.1 C), temperature source Oral, resp. rate 16, last menstrual period 11/08/2012, SpO2 100.00%.  Physical Exam  Constitutional: She is oriented to person, place, and time. She appears well-developed and well-nourished. No distress.  HENT:  Head: Normocephalic and atraumatic.  Cardiovascular: Normal rate.   Respiratory: Effort normal.  GI: Soft. She exhibits no distension and no mass. There is tenderness (mild tenderness to palpation of the left mid abdomen). There is no rebound and no guarding.  Genitourinary: Uterus is enlarged (appropriate for GA). Cervix exhibits no motion tenderness, no discharge and no friability. No bleeding around the vagina. Vaginal discharge (moderate amount of thick ,white discharge noted) found.  No pooling  Neurological: She is alert and oriented to person, place, and time.  Skin: Skin is warm and dry. No erythema.  Psychiatric: She has a normal mood and affect.  Dilation: Fingertip Effacement (%): 50 Exam by:: Ginger Morris RN  Results for orders placed during the hospital encounter of  08/04/13 (from the past 24 hour(s))  URINALYSIS, ROUTINE W REFLEX MICROSCOPIC     Status: Abnormal   Collection Time    08/04/13  5:40 PM      Result Value Ref Range   Color, Urine YELLOW  YELLOW   APPearance CLEAR  CLEAR   Specific Gravity, Urine 1.020  1.005 - 1.030   pH 7.0  5.0 - 8.0   Glucose, UA NEGATIVE  NEGATIVE mg/dL   Hgb urine dipstick NEGATIVE  NEGATIVE   Bilirubin Urine NEGATIVE  NEGATIVE   Ketones, ur NEGATIVE  NEGATIVE mg/dL   Protein, ur NEGATIVE  NEGATIVE mg/dL   Urobilinogen, UA 0.2  0.0 - 1.0 mg/dL   Nitrite NEGATIVE  NEGATIVE   Leukocytes, UA LARGE (*) NEGATIVE  URINE  MICROSCOPIC-ADD ON     Status: Abnormal   Collection Time    08/04/13  5:40 PM      Result Value Ref Range   Squamous Epithelial / LPF MANY (*) RARE   WBC, UA 3-6  <3 WBC/hpf   RBC / HPF 0-2  <3 RBC/hpf   Bacteria, UA MANY (*) RARE  WET PREP, GENITAL     Status: Abnormal   Collection Time    08/04/13  7:55 PM      Result Value Ref Range   Yeast Wet Prep HPF POC NONE SEEN  NONE SEEN   Trich, Wet Prep NONE SEEN  NONE SEEN   Clue Cells Wet Prep HPF POC NONE SEEN  NONE SEEN   WBC, Wet Prep HPF POC MANY (*) NONE SEEN  POCT FERN TEST     Status: None   Collection Time    08/04/13  7:57 PM      Result Value Ref Range   POCT Fern Test Negative = intact amniotic membranes     Fern - negative  Fetal Monitoring: Baseline: 125 bpm, moderate variability, + accelerations, no decelerations Contractions: occasional with mild UI  MAU Course  Procedures None  MDM RN consulted Dr. Reola Calkins on L&D. Patient will be monitored x 4 hours. RN to check cervix.  No pooling, no ferning. Wet prep sent. Clinical impression of yeast vulvovaginitis 150 mg Diflucan given in MAU 2100 - Patient is being monitored x 4 hours. Will be complete at 2200. Care turned over to [place], CNM  Freddi Starr, PA-C  08/04/2013, 7:25 PM  Assessment and Plan   FHR category I. No bleeding. Rare, mild contractions no change from baseline.  ASSESSMENT: 1. Traumatic injury during pregnancy in third trimester    PLAN: D/C home in stable condition. Labor precautions and FKCs.  Abruption precautions. Discussed fall prevention.    Medication List         albuterol 108 (90 BASE) MCG/ACT inhaler  Commonly known as:  PROVENTIL HFA;VENTOLIN HFA  Inhale into the lungs every 6 (six) hours as needed for wheezing or shortness of breath.     INTEGRA 62.5-62.5-40-3 MG Caps  Take 1 capsule by mouth daily.     nitrofurantoin (macrocrystal-monohydrate) 100 MG capsule  Commonly known as:  MACROBID  Take 100 mg by mouth  2 (two) times daily.     prenatal multivitamin Tabs tablet  Take 1 tablet by mouth daily at 12 noon.       Follow-up Information   Follow up with [facility] On 08/11/2013. (as scheduled or, As needed, If symptoms worsen)    Specialty:  Obstetrics and Gynecology   Contact information:   [phone]  [place] [address] [phone]      Follow up with [facility] [place] [facility]. (As needed, If symptoms worsen)    Contact information:   [id number] [place] [address] [phone]     Dorathy Kinsman, [place] 08/04/2013 10:02 PM

## 2013-08-04 NOTE — MAU Provider Note (Signed)
Attestation of Attending Supervision of Advanced Practitioner (CNM/NP): Evaluation and management procedures were performed by the Advanced Practitioner under my supervision and collaboration.  I have reviewed the Advanced Practitioner's note and chart, and I agree with the management and plan.  HARRAWAY-SMITH, Arsenio Schnorr 10:32 PM     

## 2013-08-04 NOTE — Progress Notes (Signed)
Dr Reola CalkinsBeck notified of pt's arrival and complaints orders received to monitor pt and check cervix

## 2013-08-11 ENCOUNTER — Ambulatory Visit (INDEPENDENT_AMBULATORY_CARE_PROVIDER_SITE_OTHER): Payer: Federal, State, Local not specified - PPO | Admitting: Obstetrics and Gynecology

## 2013-08-11 ENCOUNTER — Encounter: Payer: Self-pay | Admitting: Obstetrics and Gynecology

## 2013-08-11 VITALS — BP 109/66 | Wt 180.7 lb

## 2013-08-11 DIAGNOSIS — O0930 Supervision of pregnancy with insufficient antenatal care, unspecified trimester: Secondary | ICD-10-CM

## 2013-08-11 DIAGNOSIS — O0932 Supervision of pregnancy with insufficient antenatal care, second trimester: Secondary | ICD-10-CM

## 2013-08-11 LAB — POCT URINALYSIS DIP (DEVICE)
Bilirubin Urine: NEGATIVE
Glucose, UA: NEGATIVE mg/dL
KETONES UR: NEGATIVE mg/dL
NITRITE: NEGATIVE
Protein, ur: NEGATIVE mg/dL
Specific Gravity, Urine: 1.02 (ref 1.005–1.030)
Urobilinogen, UA: 1 mg/dL (ref 0.0–1.0)
pH: 7.5 (ref 5.0–8.0)

## 2013-08-11 NOTE — Progress Notes (Signed)
Pulse: 99 

## 2013-08-11 NOTE — Progress Notes (Signed)
GBS pos> explained tx in labor. Plans breast, Nexplanon. Has decided against waterbirth.

## 2013-08-11 NOTE — Patient Instructions (Signed)

## 2013-08-18 ENCOUNTER — Ambulatory Visit (INDEPENDENT_AMBULATORY_CARE_PROVIDER_SITE_OTHER): Payer: Federal, State, Local not specified - PPO | Admitting: Family Medicine

## 2013-08-18 ENCOUNTER — Encounter: Payer: Self-pay | Admitting: Family Medicine

## 2013-08-18 VITALS — BP 123/70 | Temp 97.8°F | Wt 184.4 lb

## 2013-08-18 DIAGNOSIS — O99820 Streptococcus B carrier state complicating pregnancy: Secondary | ICD-10-CM

## 2013-08-18 DIAGNOSIS — Z22330 Carrier of Group B streptococcus: Secondary | ICD-10-CM

## 2013-08-18 DIAGNOSIS — O99891 Other specified diseases and conditions complicating pregnancy: Secondary | ICD-10-CM

## 2013-08-18 DIAGNOSIS — O9989 Other specified diseases and conditions complicating pregnancy, childbirth and the puerperium: Secondary | ICD-10-CM

## 2013-08-18 DIAGNOSIS — O0930 Supervision of pregnancy with insufficient antenatal care, unspecified trimester: Secondary | ICD-10-CM

## 2013-08-18 LAB — POCT URINALYSIS DIP (DEVICE)
Bilirubin Urine: NEGATIVE
GLUCOSE, UA: NEGATIVE mg/dL
Hgb urine dipstick: NEGATIVE
Ketones, ur: NEGATIVE mg/dL
LEUKOCYTES UA: NEGATIVE
NITRITE: NEGATIVE
Protein, ur: NEGATIVE mg/dL
Specific Gravity, Urine: 1.025 (ref 1.005–1.030)
Urobilinogen, UA: 1 mg/dL (ref 0.0–1.0)
pH: 7 (ref 5.0–8.0)

## 2013-08-18 NOTE — Patient Instructions (Signed)
Third Trimester of Pregnancy  The third trimester is from week 29 through week 42, months 7 through 9. The third trimester is a time when the fetus is growing rapidly. At the end of the ninth month, the fetus is about 20 inches in length and weighs 6 10 pounds.   BODY CHANGES  Your body goes through many changes during pregnancy. The changes vary from woman to woman.    Your weight will continue to increase. You can expect to gain 25 35 pounds (11 16 kg) by the end of the pregnancy.   You may begin to get stretch marks on your hips, abdomen, and breasts.   You may urinate more often because the fetus is moving lower into your pelvis and pressing on your bladder.   You may develop or continue to have heartburn as a result of your pregnancy.   You may develop constipation because certain hormones are causing the muscles that push waste through your intestines to slow down.   You may develop hemorrhoids or swollen, bulging veins (varicose veins).   You may have pelvic pain because of the weight gain and pregnancy hormones relaxing your joints between the bones in your pelvis. Back aches may result from over exertion of the muscles supporting your posture.   Your breasts will continue to grow and be tender. A yellow discharge may leak from your breasts called colostrum.   Your belly button may stick out.   You may feel short of breath because of your expanding uterus.   You may notice the fetus "dropping," or moving lower in your abdomen.   You may have a bloody mucus discharge. This usually occurs a few days to a week before labor begins.   Your cervix becomes thin and soft (effaced) near your due date.  WHAT TO EXPECT AT YOUR PRENATAL EXAMS   You will have prenatal exams every 2 weeks until week 36. Then, you will have weekly prenatal exams. During a routine prenatal visit:   You will be weighed to make sure you and the fetus are growing normally.   Your blood pressure is taken.   Your abdomen will be  measured to track your baby's growth.   The fetal heartbeat will be listened to.   Any test results from the previous visit will be discussed.   You may have a cervical check near your due date to see if you have effaced.  At around 36 weeks, your caregiver will check your cervix. At the same time, your caregiver will also perform a test on the secretions of the vaginal tissue. This test is to determine if a type of bacteria, Group B streptococcus, is present. Your caregiver will explain this further.  Your caregiver may ask you:   What your birth plan is.   How you are feeling.   If you are feeling the baby move.   If you have had any abnormal symptoms, such as leaking fluid, bleeding, severe headaches, or abdominal cramping.   If you have any questions.  Other tests or screenings that may be performed during your third trimester include:   Blood tests that check for low iron levels (anemia).   Fetal testing to check the health, activity level, and growth of the fetus. Testing is done if you have certain medical conditions or if there are problems during the pregnancy.  FALSE LABOR  You may feel small, irregular contractions that eventually go away. These are called Braxton Hicks contractions, or   false labor. Contractions may last for hours, days, or even weeks before true labor sets in. If contractions come at regular intervals, intensify, or become painful, it is best to be seen by your caregiver.   SIGNS OF LABOR    Menstrual-like cramps.   Contractions that are 5 minutes apart or less.   Contractions that start on the top of the uterus and spread down to the lower abdomen and back.   A sense of increased pelvic pressure or back pain.   A watery or bloody mucus discharge that comes from the vagina.  If you have any of these signs before the 37th week of pregnancy, call your caregiver right away. You need to go to the hospital to get checked immediately.  HOME CARE INSTRUCTIONS    Avoid all  smoking, herbs, alcohol, and unprescribed drugs. These chemicals affect the formation and growth of the baby.   Follow your caregiver's instructions regarding medicine use. There are medicines that are either safe or unsafe to take during pregnancy.   Exercise only as directed by your caregiver. Experiencing uterine cramps is a good sign to stop exercising.   Continue to eat regular, healthy meals.   Wear a good support bra for breast tenderness.   Do not use hot tubs, steam rooms, or saunas.   Wear your seat belt at all times when driving.   Avoid raw meat, uncooked cheese, cat litter boxes, and soil used by cats. These carry germs that can cause birth defects in the baby.   Take your prenatal vitamins.   Try taking a stool softener (if your caregiver approves) if you develop constipation. Eat more high-fiber foods, such as fresh vegetables or fruit and whole grains. Drink plenty of fluids to keep your urine clear or pale yellow.   Take warm sitz baths to soothe any pain or discomfort caused by hemorrhoids. Use hemorrhoid cream if your caregiver approves.   If you develop varicose veins, wear support hose. Elevate your feet for 15 minutes, 3 4 times a day. Limit salt in your diet.   Avoid heavy lifting, wear low heal shoes, and practice good posture.   Rest a lot with your legs elevated if you have leg cramps or low back pain.   Visit your dentist if you have not gone during your pregnancy. Use a soft toothbrush to brush your teeth and be gentle when you floss.   A sexual relationship may be continued unless your caregiver directs you otherwise.   Do not travel far distances unless it is absolutely necessary and only with the approval of your caregiver.   Take prenatal classes to understand, practice, and ask questions about the labor and delivery.   Make a trial run to the hospital.   Pack your hospital bag.   Prepare the baby's nursery.   Continue to go to all your prenatal visits as directed  by your caregiver.  SEEK MEDICAL CARE IF:   You are unsure if you are in labor or if your water has broken.   You have dizziness.   You have mild pelvic cramps, pelvic pressure, or nagging pain in your abdominal area.   You have persistent nausea, vomiting, or diarrhea.   You have a bad smelling vaginal discharge.   You have pain with urination.  SEEK IMMEDIATE MEDICAL CARE IF:    You have a fever.   You are leaking fluid from your vagina.   You have spotting or bleeding from your vagina.     You have severe abdominal cramping or pain.   You have rapid weight loss or gain.   You have shortness of breath with chest pain.   You notice sudden or extreme swelling of your face, hands, ankles, feet, or legs.   You have not felt your baby move in over an hour.   You have severe headaches that do not go away with medicine.   You have vision changes.  Document Released: 06/04/2001 Document Revised: 02/10/2013 Document Reviewed: 08/11/2012  [employer] Patient Information 2014 [employer]

## 2013-08-18 NOTE — Progress Notes (Signed)
P = 98 
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
## 2013-08-26 ENCOUNTER — Ambulatory Visit (INDEPENDENT_AMBULATORY_CARE_PROVIDER_SITE_OTHER): Payer: Federal, State, Local not specified - PPO | Admitting: Obstetrics & Gynecology

## 2013-08-26 VITALS — BP 122/70 | Temp 97.4°F | Wt 190.0 lb

## 2013-08-26 DIAGNOSIS — O480 Post-term pregnancy: Secondary | ICD-10-CM | POA: Insufficient documentation

## 2013-08-26 DIAGNOSIS — O0930 Supervision of pregnancy with insufficient antenatal care, unspecified trimester: Secondary | ICD-10-CM

## 2013-08-26 DIAGNOSIS — Z9149 Other personal history of psychological trauma, not elsewhere classified: Secondary | ICD-10-CM

## 2013-08-26 LAB — POCT URINALYSIS DIP (DEVICE)
BILIRUBIN URINE: NEGATIVE
Glucose, UA: NEGATIVE mg/dL
Hgb urine dipstick: NEGATIVE
Ketones, ur: NEGATIVE mg/dL
LEUKOCYTES UA: NEGATIVE
NITRITE: NEGATIVE
Protein, ur: NEGATIVE mg/dL
Specific Gravity, Urine: 1.02 (ref 1.005–1.030)
UROBILINOGEN UA: 1 mg/dL (ref 0.0–1.0)
pH: 7 (ref 5.0–8.0)

## 2013-08-26 NOTE — Progress Notes (Signed)
Postdates NST performed today was reviewed and was found to be reactive. AFI normal at 14.2 cm.  IOL scheduled for postdates on 08/30/13 at 1930 as per patient preference No other complaints or concerns.  Fetal movement and labor precautions reviewed.

## 2013-08-26 NOTE — Progress Notes (Signed)
Pulse- 106 Patient reports pelvic pressure

## 2013-08-26 NOTE — Patient Instructions (Signed)
Return to clinic for any obstetric concerns or go to MAU for evaluation  

## 2013-08-27 ENCOUNTER — Telehealth (HOSPITAL_COMMUNITY): Payer: Self-pay | Admitting: *Deleted

## 2013-08-27 ENCOUNTER — Ambulatory Visit (HOSPITAL_COMMUNITY): Payer: Federal, State, Local not specified - PPO

## 2013-08-27 NOTE — Telephone Encounter (Signed)
Preadmission screen  

## 2013-08-30 ENCOUNTER — Inpatient Hospital Stay (HOSPITAL_COMMUNITY)
Admission: AD | Admit: 2013-08-30 | Discharge: 2013-09-01 | DRG: 775 | Disposition: A | Discharge status: Home / Self Care | Payer: Federal, State, Local not specified - PPO | Source: Ambulatory Visit | Attending: Obstetrics & Gynecology | Admitting: Obstetrics & Gynecology

## 2013-08-30 ENCOUNTER — Inpatient Hospital Stay (HOSPITAL_COMMUNITY): Admission: RE | Admit: 2013-08-30 | Payer: Federal, State, Local not specified - PPO | Source: Ambulatory Visit

## 2013-08-30 ENCOUNTER — Inpatient Hospital Stay (HOSPITAL_COMMUNITY): Payer: Federal, State, Local not specified - PPO | Admitting: Anesthesiology

## 2013-08-30 ENCOUNTER — Encounter (HOSPITAL_COMMUNITY): Payer: Federal, State, Local not specified - PPO | Admitting: Anesthesiology

## 2013-08-30 ENCOUNTER — Encounter (HOSPITAL_COMMUNITY): Payer: Self-pay | Admitting: *Deleted

## 2013-08-30 ENCOUNTER — Encounter: Payer: Self-pay | Admitting: Obstetrics & Gynecology

## 2013-08-30 DIAGNOSIS — O99892 Other specified diseases and conditions complicating childbirth: Secondary | ICD-10-CM | POA: Diagnosis present

## 2013-08-30 DIAGNOSIS — Z22330 Carrier of Group B streptococcus: Secondary | ICD-10-CM

## 2013-08-30 DIAGNOSIS — D649 Anemia, unspecified: Secondary | ICD-10-CM | POA: Diagnosis present

## 2013-08-30 DIAGNOSIS — O9989 Other specified diseases and conditions complicating pregnancy, childbirth and the puerperium: Secondary | ICD-10-CM

## 2013-08-30 DIAGNOSIS — IMO0001 Reserved for inherently not codable concepts without codable children: Secondary | ICD-10-CM

## 2013-08-30 DIAGNOSIS — O9902 Anemia complicating childbirth: Secondary | ICD-10-CM | POA: Diagnosis present

## 2013-08-30 DIAGNOSIS — F341 Dysthymic disorder: Secondary | ICD-10-CM | POA: Diagnosis present

## 2013-08-30 DIAGNOSIS — O99344 Other mental disorders complicating childbirth: Secondary | ICD-10-CM | POA: Diagnosis present

## 2013-08-30 LAB — RPR: RPR: NONREACTIVE

## 2013-08-30 LAB — CBC
HCT: 30.9 % — ABNORMAL LOW (ref 36.0–46.0)
Hemoglobin: 10.8 g/dL — ABNORMAL LOW (ref 12.0–15.0)
MCH: 30.4 pg (ref 26.0–34.0)
MCHC: 35 g/dL (ref 30.0–36.0)
MCV: 87 fL (ref 78.0–100.0)
PLATELETS: 263 10*3/uL (ref 150–400)
RBC: 3.55 MIL/uL — AB (ref 3.87–5.11)
RDW: 13.4 % (ref 11.5–15.5)
WBC: 9.8 10*3/uL (ref 4.0–10.5)

## 2013-08-30 MED ORDER — FENTANYL 2.5 MCG/ML BUPIVACAINE 1/10 % EPIDURAL INFUSION (WH - ANES)
14.0000 mL/h | INTRAMUSCULAR | Status: DC | PRN
  Administered 2013-08-30 (×3): 14 mL/h via EPIDURAL
  Filled 2013-08-30 (×4): qty 125

## 2013-08-30 MED ORDER — LACTATED RINGERS IV SOLN
INTRAVENOUS | Status: DC
  Administered 2013-08-30 (×5): via INTRAVENOUS

## 2013-08-30 MED ORDER — EPHEDRINE 5 MG/ML INJ
10.0000 mg | INTRAVENOUS | Status: DC | PRN
  Filled 2013-08-30: qty 4

## 2013-08-30 MED ORDER — CITRIC ACID-SODIUM CITRATE 334-500 MG/5ML PO SOLN: 30.0000 mL | ORAL | Status: DC | PRN

## 2013-08-30 MED ORDER — LACTATED RINGERS IV SOLN: 500.0000 mL | INTRAVENOUS | Status: DC | PRN

## 2013-08-30 MED ORDER — ACETAMINOPHEN 325 MG PO TABS: 650.0000 mg | ORAL_TABLET | ORAL | Status: DC | PRN

## 2013-08-30 MED ORDER — OXYCODONE-ACETAMINOPHEN 5-325 MG PO TABS: 1.0000 | ORAL_TABLET | ORAL | Status: DC | PRN

## 2013-08-30 MED ORDER — OXYTOCIN 40 UNITS IN LACTATED RINGERS INFUSION - SIMPLE MED
62.5000 mL/h | INTRAVENOUS | Status: DC
  Administered 2013-08-31: 62.5 mL/h via INTRAVENOUS

## 2013-08-30 MED ORDER — IBUPROFEN 600 MG PO TABS: 600.0000 mg | ORAL_TABLET | Freq: Four times a day (QID) | ORAL | Status: DC | PRN

## 2013-08-30 MED ORDER — PENICILLIN G POTASSIUM 5000000 UNITS IJ SOLR
2.5000 10*6.[IU] | INTRAVENOUS | Status: DC
  Administered 2013-08-30 (×4): 2.5 10*6.[IU] via INTRAVENOUS
  Filled 2013-08-30 (×9): qty 2.5

## 2013-08-30 MED ORDER — BUTORPHANOL TARTRATE 1 MG/ML IJ SOLN
1.0000 mg | INTRAMUSCULAR | Status: DC | PRN
  Administered 2013-08-30 (×2): 1 mg via INTRAVENOUS
  Filled 2013-08-30 (×2): qty 1

## 2013-08-30 MED ORDER — ONDANSETRON HCL 4 MG/2ML IJ SOLN
4.0000 mg | Freq: Four times a day (QID) | INTRAMUSCULAR | Status: DC | PRN
  Administered 2013-08-30 (×2): 4 mg via INTRAVENOUS
  Filled 2013-08-30 (×2): qty 2

## 2013-08-30 MED ORDER — PENICILLIN G POTASSIUM 5000000 UNITS IJ SOLR
5.0000 10*6.[IU] | Freq: Once | INTRAMUSCULAR | Status: AC
  Administered 2013-08-30: 5 10*6.[IU] via INTRAVENOUS
  Filled 2013-08-30: qty 5

## 2013-08-30 MED ORDER — OXYTOCIN 40 UNITS IN LACTATED RINGERS INFUSION - SIMPLE MED
1.0000 m[IU]/min | INTRAVENOUS | Status: DC
  Administered 2013-08-30: 2 m[IU]/min via INTRAVENOUS
  Filled 2013-08-30: qty 1000

## 2013-08-30 MED ORDER — DIPHENHYDRAMINE HCL 50 MG/ML IJ SOLN: 12.5000 mg | INTRAMUSCULAR | Status: DC | PRN

## 2013-08-30 MED ORDER — TERBUTALINE SULFATE 1 MG/ML IJ SOLN: 0.2500 mg | Freq: Once | INTRAMUSCULAR | Status: AC | PRN

## 2013-08-30 MED ORDER — BUPIVACAINE HCL (PF) 0.25 % IJ SOLN
INTRAMUSCULAR | Status: DC | PRN
Start: 2013-08-30 — End: 2013-08-31
  Administered 2013-08-30 (×2): 5 mL via EPIDURAL

## 2013-08-30 MED ORDER — LIDOCAINE HCL (PF) 1 % IJ SOLN
30.0000 mL | INTRAMUSCULAR | Status: DC | PRN
  Filled 2013-08-30: qty 30

## 2013-08-30 MED ORDER — EPHEDRINE 5 MG/ML INJ
10.0000 mg | INTRAVENOUS | Status: DC | PRN
Start: 2013-08-30 — End: 2013-08-31
  Filled 2013-08-30: qty 4

## 2013-08-30 MED ORDER — OXYTOCIN BOLUS FROM INFUSION: 500.0000 mL | INTRAVENOUS | Status: DC

## 2013-08-30 MED ORDER — PHENYLEPHRINE 40 MCG/ML (10ML) SYRINGE FOR IV PUSH (FOR BLOOD PRESSURE SUPPORT): 80.0000 ug | PREFILLED_SYRINGE | INTRAVENOUS | Status: DC | PRN

## 2013-08-30 MED ORDER — LACTATED RINGERS IV SOLN
500.0000 mL | Freq: Once | INTRAVENOUS | Status: AC
  Administered 2013-08-30: 500 mL via INTRAVENOUS

## 2013-08-30 MED ORDER — PHENYLEPHRINE 40 MCG/ML (10ML) SYRINGE FOR IV PUSH (FOR BLOOD PRESSURE SUPPORT)
80.0000 ug | PREFILLED_SYRINGE | INTRAVENOUS | Status: DC | PRN
  Filled 2013-08-30 (×2): qty 10

## 2013-08-30 MED ORDER — LIDOCAINE HCL (PF) 1 % IJ SOLN
INTRAMUSCULAR | Status: DC | PRN
  Administered 2013-08-30 (×4): 4 mL

## 2013-08-30 NOTE — Anesthesia Preprocedure Evaluation (Signed)
Anesthesia Evaluation  Patient identified by MRN, date of birth, ID band Patient awake    Reviewed: Allergy & Precautions, H&P , NPO status , Patient's Chart, lab work & pertinent test results, reviewed documented beta blocker date and time   History of Anesthesia Complications Negative for: history of anesthetic complications  Airway Mallampati: II TM Distance: >3 FB Neck ROM: full    Dental  (+) Teeth Intact   Pulmonary asthma (exercise-induced - no inhaler use in years) ,  breath sounds clear to auscultation        Cardiovascular negative cardio ROS  Rhythm:regular Rate:Normal     Neuro/Psych Anxiety Depression negative neurological ROS     GI/Hepatic negative GI ROS, Neg liver ROS,   Endo/Other  negative endocrine ROS  Renal/GU negative Renal ROS     Musculoskeletal   Abdominal   Peds  Hematology  (+) anemia ,   Anesthesia Other Findings   Reproductive/Obstetrics (+) Pregnancy                           Anesthesia Physical Anesthesia Plan  ASA: II  Anesthesia Plan: Epidural   Post-op Pain Management:    Induction:   Airway Management Planned:   Additional Equipment:   Intra-op Plan:   Post-operative Plan:   Informed Consent: I have reviewed the patients History and Physical, chart, labs and discussed the procedure including the risks, benefits and alternatives for the proposed anesthesia with the patient or authorized representative who has indicated his/her understanding and acceptance.     Plan Discussed with:   Anesthesia Plan Comments:         Anesthesia Quick Evaluation

## 2013-08-30 NOTE — MAU Note (Signed)
contractions 

## 2013-08-30 NOTE — Progress Notes (Signed)
Stacey Rhodes is a 18 y.o. G1P0000 at 4546w0d admitted for active labor  Subjective: Comfortable with epidural  Objective: BP 116/65  Pulse 86  Temp(Src) 98.3 F (36.8 C) (Oral)  Resp 18  Ht 5\' 7"  (1.702 m)  Wt 84.369 kg (186 lb)  BMI 29.12 kg/m2  SpO2 98%  LMP 11/08/2012      FHT:  FHR: 130s bpm, variability: moderate,  accelerations:  Present,  decelerations:  Absent UC:   irregular, every 3-8 minutes SVE:   Dilation: 5 Effacement (%): 90 Station: -3 Exam by:: C Beazer HomesMcIntosh RN  Labs: Lab Results  Component Value Date   WBC 9.8 08/30/2013   HGB 10.8* 08/30/2013   HCT 30.9* 08/30/2013   MCV 87.0 08/30/2013   PLT 263 08/30/2013    Assessment / Plan: Augmentation of labor, progressing well  Labor: Progressing normally but ctx spaced, will start on pit 2x2 Preeclampsia:  no signs or symptoms of toxicity Fetal Wellbeing:  Category I Pain Control:  Epidural I/D:  GBS + Anticipated MOD:  NSVD  Stacey Rhodes 08/30/2013, 10:27 AM

## 2013-08-30 NOTE — Progress Notes (Signed)
Vick FreesDebrisha L Cutbirth is a 18 y.o. G1P0000 at 8366w0d admitted for active labor  Subjective: No complaints  Objective: BP 123/78  Pulse 81  Temp(Src) 98.9 F (37.2 C) (Oral)  Resp 16  Ht 5\' 7"  (1.702 m)  Wt 84.369 kg (186 lb)  BMI 29.12 kg/m2  SpO2 92%  LMP 11/08/2012 I/O last 3 completed shifts: In: -  Out: 800 [Urine:800]    FHT:  FHR: 140 bpm, variability: moderate,  accelerations:  Present,  decelerations:  Absent UC:   regular, every 2 minutes SVE:   Dilation: Lip/rim Effacement (%): 100 Station: +1 Exam by:: Dr Ike Benedom   Labs: Lab Results  Component Value Date   WBC 9.8 08/30/2013   HGB 10.8* 08/30/2013   HCT 30.9* 08/30/2013   MCV 87.0 08/30/2013   PLT 263 08/30/2013   Assessment / Plan: Augmentation of labor, progressing well  Labor: Progressing normally and on pit Preeclampsia:  na Fetal Wellbeing:  Category I Pain Control:  Epidural I/D:  gbs on PCN Anticipated MOD:  NSVD  Wenda LowJoyner, Tyjae Issa 08/30/2013, 9:06 PM

## 2013-08-30 NOTE — H&P (Signed)
I examined pt and agree with documentation above and resident plan of care. MUHAMMAD,WALIDAH  

## 2013-08-30 NOTE — Progress Notes (Signed)
Vick FreesDebrisha L Yoshida is a 18 y.o. G1P0000 at 7378w0d admitted for active labor  Subjective: Comfortable on epidural  Objective: BP 110/57  Pulse 104  Temp(Src) 99.3 F (37.4 C) (Oral)  Resp 18  Ht 5\' 7"  (1.702 m)  Wt 84.369 kg (186 lb)  BMI 29.12 kg/m2  SpO2 98%  LMP 11/08/2012      FHT:  FHR: 150 bpm, variability: moderate,  accelerations:  Present,  decelerations:  Absent UC:   regular, every 3 minutes SVE:   Dilation: 8 Effacement (%): 80 Station: -1 Exam by:: Halima Fogal, MD  Labs: Lab Results  Component Value Date   WBC 9.8 08/30/2013   HGB 10.8* 08/30/2013   HCT 30.9* 08/30/2013   MCV 87.0 08/30/2013   PLT 263 08/30/2013    Assessment / Plan: Augmentation of labor, progressing well.   Labor: Progressing normally on pit 2x2. AROM. Preeclampsia:  no signs or symptoms of toxicity Fetal Wellbeing:  Category I Pain Control:  Epidural I/D:  GBS + Anticipated MOD:  NSVD  Michaelene SongHall, Jonathan C 08/30/2013, 2:13 PM   I spoke with and examined patient and agree with resident's note and plan of care.  Tawana ScaleMichael Ryan Howie Rufus, MD OB Fellow 08/30/2013 2:41 PM

## 2013-08-30 NOTE — Progress Notes (Signed)
S. Comfortable with epidural after a bolus O. VSS, AF. FHR- Cateogory 1      CVX- non change 8/90/-2      Pit at 4      IUPC placed      EFW 8 1/2 pounds      Pelvis- feels adequate  A/P. IOL- poor progession. See if contractions are adequate. More pit prn

## 2013-08-30 NOTE — Progress Notes (Signed)
PT requesting epidural,per MD pay have epidural

## 2013-08-30 NOTE — H&P (Addendum)
Stacey Rhodes is a 18 y.o. female G1P0000 with IUP at 7523w2d by US at 9wks presenting for active labor. Pt states she has been having regular, every 3-5 minutes contractions, associated with spotting vaginal bleeding.  Membranes are intact, with active fetal movement.    [facility]  Prenatal History/Complications: GBS +  Past Medical History: Past Medical History  Diagnosis Date  . Asthma   . Seasonal allergies   . Depression     Past Surgical History: Past Surgical History  Procedure Laterality Date  . No past surgeries      Obstetrical History: OB History   Grav Para Term Preterm Abortions TAB SAB Ect Mult Living   1 0 0 0 0 0 0 0 0 0      Social History: History   Social History  . Marital Status: Single    Spouse Name: N/A    Number of Children: N/A  . Years of Education: N/A   Social History Main Topics  . Smoking status: Never Smoker   . Smokeless tobacco: Never Used  . Alcohol Use: No  . Drug Use: No  . Sexual Activity: Not Currently    Birth Control/ Protection: None   Other Topics Concern  . None   Social History Narrative  . None    Family History: Family History  Problem Relation Age of Onset  . Cancer Maternal Grandmother    Allergies: No Known Allergies  Prescriptions prior to admission  Medication Sig Dispense Refill  . albuterol (PROVENTIL HFA;VENTOLIN HFA) 108 (90 BASE) MCG/ACT inhaler Inhale into the lungs every 6 (six) hours as needed for wheezing or shortness of breath.      . Prenatal Vit-Fe Fumarate-FA (PRENATAL MULTIVITAMIN) TABS tablet Take 1 tablet by mouth daily at 12 noon.  30 tablet  10    Review of Systems: Negative unless otherwise stated in History above  Physicial Blood pressure 127/65, pulse 85, temperature 97 F (36.1 C), temperature source Oral, resp. rate 18, height 5\' 7"  (1.702 m), weight 84.369 kg (186 lb), last menstrual period 11/08/2012, SpO2 100.00%. General appearance: alert, cooperative  and no distress Lungs: clear to auscultation bilaterally Heart: regular rate and rhythm Abdomen: soft, non-tender; bowel sounds normal Extremities: Homans sign is negative, no sign of DVT Presentation: cephalic Fetal monitoringBaseline: 145 bpm, Variability: Good {> 6 bpm), Accelerations: Reactive and Decelerations: Absent Uterine activityFrequency: Every 3-5 minutes Dilation: 3 Effacement (%): 50 Station: -3 Exam by:: Roney MarionW. Muhammad, CNM  Prenatal labs: ABO, Rh: A/POS/-- (11/25 1313) Antibody: NEG (11/25 1313) Rubella:   RPR: NON REAC (11/25 1313)  HBsAg: NEGATIVE (11/25 1313)  HIV: NON REACTIVE (11/25 1313)  GBS: Positive (02/03 0000)  GTT: 1 hr = 68  Prenatal Transfer Tool  Maternal Diabetes: No Genetic Screening: Declined Maternal Ultrasounds/Referrals: Normal Fetal Ultrasounds or other Referrals:  None Maternal Substance Abuse:  No Significant Maternal Medications:  None Significant Maternal Lab Results: Lab values include: Group B Strep positive  No results found for this or any previous visit (from the past 24 hour(s)).  Assessment: Stacey Rhodes is a 18 y.o. G1P0000 at 1523w2d by here for active labor  #Labor:progressing normally, expectant management #Pain: Discussed options; labor support for now #FWB: Cat 1 #ID:  GBS +; Penicillin #Feeding: Breast #MOC:IUD  Wenda LowJames Joyner MD [facility] PGY[facility]1 08/30/2013, 3:56 AM  I examined pt and agree with documentation above and resident plan of care. [facility],WALIDAH

## 2013-08-30 NOTE — Anesthesia Procedure Notes (Signed)
Epidural Patient location during procedure: OB Start time: 08/30/2013 8:13 AM  Staffing Performed by: anesthesiologist   Preanesthetic Checklist Completed: patient identified, site marked, surgical consent, pre-op evaluation, timeout performed, IV checked, risks and benefits discussed and monitors and equipment checked  Epidural Patient position: sitting Prep: site prepped and draped and DuraPrep Patient monitoring: continuous pulse ox and blood pressure Approach: midline Injection technique: LOR air  Needle:  Needle type: Tuohy  Needle gauge: 17 G Needle length: 9 cm and 9 Needle insertion depth: 5 cm cm Catheter type: closed end flexible Catheter size: 19 Gauge Catheter at skin depth: 10 cm Test dose: negative  Assessment Events: blood not aspirated, injection not painful, no injection resistance, negative IV test and no paresthesia  Additional Notes Discussed risk of headache, infection, bleeding, nerve injury and failed or incomplete block.  Patient voices understanding and wishes to proceed.  Epidural placed easily on first attempt.  No paresthesia.  Patient tolerated procedure well with no apparent complications.  Jasmine DecemberA. Tibor Lemmons, MDReason for block:procedure for pain

## 2013-08-31 ENCOUNTER — Encounter (HOSPITAL_COMMUNITY): Payer: Self-pay

## 2013-08-31 DIAGNOSIS — O9902 Anemia complicating childbirth: Secondary | ICD-10-CM

## 2013-08-31 DIAGNOSIS — D649 Anemia, unspecified: Secondary | ICD-10-CM

## 2013-08-31 DIAGNOSIS — O99344 Other mental disorders complicating childbirth: Secondary | ICD-10-CM

## 2013-08-31 LAB — CBC
HCT: 28.2 % — ABNORMAL LOW (ref 36.0–46.0)
Hemoglobin: 9.8 g/dL — ABNORMAL LOW (ref 12.0–15.0)
MCH: 30.4 pg (ref 26.0–34.0)
MCHC: 34.8 g/dL (ref 30.0–36.0)
MCV: 87.6 fL (ref 78.0–100.0)
Platelets: 240 10*3/uL (ref 150–400)
RBC: 3.22 MIL/uL — ABNORMAL LOW (ref 3.87–5.11)
RDW: 13.4 % (ref 11.5–15.5)
WBC: 13.1 10*3/uL — ABNORMAL HIGH (ref 4.0–10.5)

## 2013-08-31 MED ORDER — PRENATAL MULTIVITAMIN CH
1.0000 | ORAL_TABLET | Freq: Every day | ORAL | Status: DC
  Administered 2013-08-31 – 2013-09-01 (×2): 1 via ORAL
  Filled 2013-08-31 (×2): qty 1

## 2013-08-31 MED ORDER — DIPHENHYDRAMINE HCL 25 MG PO CAPS: 25.0000 mg | ORAL_CAPSULE | Freq: Four times a day (QID) | ORAL | Status: DC | PRN

## 2013-08-31 MED ORDER — TETANUS-DIPHTH-ACELL PERTUSSIS 5-2.5-18.5 LF-MCG/0.5 IM SUSP
0.5000 mL | Freq: Once | INTRAMUSCULAR | Status: AC
  Administered 2013-09-01: 0.5 mL via INTRAMUSCULAR
  Filled 2013-08-31: qty 0.5

## 2013-08-31 MED ORDER — ONDANSETRON HCL 4 MG/2ML IJ SOLN: 4.0000 mg | INTRAMUSCULAR | Status: DC | PRN

## 2013-08-31 MED ORDER — SIMETHICONE 80 MG PO CHEW
80.0000 mg | CHEWABLE_TABLET | ORAL | Status: DC | PRN
Start: 2013-08-31 — End: 2013-09-01

## 2013-08-31 MED ORDER — DIBUCAINE 1 % RE OINT
1.0000 "application " | TOPICAL_OINTMENT | RECTAL | Status: DC | PRN
  Filled 2013-08-31: qty 28

## 2013-08-31 MED ORDER — SENNOSIDES-DOCUSATE SODIUM 8.6-50 MG PO TABS
2.0000 | ORAL_TABLET | ORAL | Status: DC
  Administered 2013-08-31: 2 via ORAL
  Filled 2013-08-31: qty 2

## 2013-08-31 MED ORDER — WITCH HAZEL-GLYCERIN EX PADS: 1.0000 "application " | MEDICATED_PAD | CUTANEOUS | Status: DC | PRN

## 2013-08-31 MED ORDER — OXYCODONE-ACETAMINOPHEN 5-325 MG PO TABS: 1.0000 | ORAL_TABLET | ORAL | Status: DC | PRN

## 2013-08-31 MED ORDER — IBUPROFEN 600 MG PO TABS
600.0000 mg | ORAL_TABLET | Freq: Four times a day (QID) | ORAL | Status: DC
  Administered 2013-08-31 – 2013-09-01 (×6): 600 mg via ORAL
  Filled 2013-08-31 (×6): qty 1

## 2013-08-31 MED ORDER — PNEUMOCOCCAL VAC POLYVALENT 25 MCG/0.5ML IJ INJ
0.5000 mL | INJECTION | INTRAMUSCULAR | Status: AC
  Administered 2013-09-01: 0.5 mL via INTRAMUSCULAR
  Filled 2013-08-31: qty 0.5

## 2013-08-31 MED ORDER — ZOLPIDEM TARTRATE 5 MG PO TABS: 5.0000 mg | ORAL_TABLET | Freq: Every evening | ORAL | Status: DC | PRN

## 2013-08-31 MED ORDER — ONDANSETRON HCL 4 MG PO TABS: 4.0000 mg | ORAL_TABLET | ORAL | Status: DC | PRN

## 2013-08-31 MED ORDER — BENZOCAINE-MENTHOL 20-0.5 % EX AERO
1.0000 "application " | INHALATION_SPRAY | CUTANEOUS | Status: DC | PRN
  Administered 2013-08-31: 1 via TOPICAL
  Filled 2013-08-31 (×2): qty 56

## 2013-08-31 MED ORDER — LANOLIN HYDROUS EX OINT: TOPICAL_OINTMENT | CUTANEOUS | Status: DC | PRN

## 2013-08-31 NOTE — Anesthesia Postprocedure Evaluation (Signed)
  Anesthesia Post-op Note  Patient: Stacey Rhodes  Procedure(s) Performed: * No procedures listed *  Patient Location: Mother/Baby  Anesthesia Type:Epidural  Level of Consciousness: awake  Airway and Oxygen Therapy: Patient Spontanous Breathing  Post-op Pain: none  Post-op Assessment: Patient's Cardiovascular Status Stable, Respiratory Function Stable, Patent Airway, No signs of Nausea or vomiting, Adequate PO intake, Pain level controlled, No headache, No backache, No residual numbness and No residual motor weakness  Post-op Vital Signs: Reviewed and stable  Complications: No apparent anesthesia complications

## 2013-08-31 NOTE — Progress Notes (Signed)
CSW attempted to assess pt however several visitors were present. CSW will return at a later time.

## 2013-09-01 MED ORDER — IBUPROFEN 600 MG PO TABS: 600.0000 mg | ORAL_TABLET | Freq: Four times a day (QID) | ORAL | Status: DC

## 2013-09-01 NOTE — Discharge Instructions (Signed)
Vaginal Delivery °Care After °Refer to this sheet in the next few weeks. These discharge instructions provide you with information on caring for yourself after delivery. Your caregiver may also give you specific instructions. Your treatment has been planned according to the most current medical practices available, but problems sometimes occur. Call your caregiver if you have any problems or questions after you go home. °HOME CARE INSTRUCTIONS °· Take over-the-counter or prescription medicines only as directed by your caregiver or pharmacist. °· Do not drink alcohol, especially if you are breastfeeding or taking medicine to relieve pain. °· Do not chew or smoke tobacco. °· Do not use illegal drugs. °· Continue to use good perineal care. Good perineal care includes: °· Wiping your perineum from front to back. °· Keeping your perineum clean. °· Do not use tampons or douche until your caregiver says it is okay. °· Shower, wash your hair, and take tub baths as directed by your caregiver. °· Wear a well-fitting bra that provides breast support. °· Eat healthy foods. °· Drink enough fluids to keep your urine clear or pale yellow. °· Eat high-fiber foods such as whole grain cereals and breads, brown rice, beans, and fresh fruits and vegetables every day. These foods may help prevent or relieve constipation. °· Follow your cargiver's recommendations regarding resumption of activities such as climbing stairs, driving, lifting, exercising, or traveling. °· Talk to your caregiver about resuming sexual activities. Resumption of sexual activities is dependent upon your risk of infection, your rate of healing, and your comfort and desire to resume sexual activity. °· Try to have someone help you with your household activities and your newborn for at least a few days after you leave the hospital. °· Rest as much as possible. Try to rest or take a nap when your newborn is sleeping. °· Increase your activities gradually. °· Keep all  of your scheduled postpartum appointments. It is very important to keep your scheduled follow-up appointments. At these appointments, your caregiver will be checking to make sure that you are healing physically and emotionally. °SEEK MEDICAL CARE IF:  °· You are passing large clots from your vagina. Save any clots to show your caregiver. °· You have a foul smelling discharge from your vagina. °· You have trouble urinating. °· You are urinating frequently. °· You have pain when you urinate. °· You have a change in your bowel movements. °· You have increasing redness, pain, or swelling near your vaginal incision (episiotomy) or vaginal tear. °· You have pus draining from your episiotomy or vaginal tear. °· Your episiotomy or vaginal tear is separating. °· You have painful, hard, or reddened breasts. °· You have a severe headache. °· You have blurred vision or see spots. °· You feel sad or depressed. °· You have thoughts of hurting yourself or your newborn. °· You have questions about your care, the care of your newborn, or medicines. °· You are dizzy or lightheaded. °· You have a rash. °· You have nausea or vomiting. °· You were breastfeeding and have not had a menstrual period within 12 weeks after you stopped breastfeeding. °· You are not breastfeeding and have not had a menstrual period by the 12th week after delivery. °· You have a fever. °SEEK IMMEDIATE MEDICAL CARE IF:  °· You have persistent pain. °· You have chest pain. °· You have shortness of breath. °· You faint. °· You have leg pain. °· You have stomach pain. °· Your vaginal bleeding saturates two or more sanitary pads   in 1 hour. MAKE SURE YOU:   Understand these instructions.  Will watch your condition.  Will get help right away if you are not doing well or get worse. Document Released: 06/07/2000 Document Revised: 03/04/2012 Document Reviewed: 02/05/2012 [facility] Patient Information 2014 [place], [place]. Breastfeeding After Breast Surgery If  you have had or plan to have breast surgery, including breast implants, breast augmentation, or a breast reduction, you might be worried about whether you will be able to produce enough milk for your baby. Talk with a Lactation Specialist and your surgeon. Let them know your concerns. There are ways your surgeon can preserve as much of the breast tissue, nerve function and milk ducts as possible.  The most important things that affect your milk supply are:  How your surgery was done.  Where your cuts (incisions) are located.  The reasons for your surgery. If your milk ducts and major nerves were not cut, your milk supply may not be affected. Women who have had incisions in the fold under the breasts or near the armpits are less likely to have problems producing milk. Women with incisions around or across the darker area around the nipple (areola) are more likely to have difficulties since this indicates some milk ducts were cut and nerves were possibly damaged. A loss of sensation in one or both nipples may mean there is nerve damage. This will affect the let down reflex and ultimately influence milk supply.  If you have had breast reduction surgery, it is likely to affect your ability to breastfeed. This is because sections of the breast are often removed during the procedure including milk ducts. In addition, major nerves are usually cut. If your nipple was removed during the surgery and then reattached, all milk ducts and major nerves were cut, which decreases the chance that breastfeeding will be successful. However, there are cases where the ducts and nerves have grown back together and the woman is able to breastfeed. If surgery is done on one breast only, the mother should be able to successfully breastfeed with the other breast. The ability to express the first milk (colostrum) during pregnancy is not a reliable gauge because many women who are unable to do so are able to lactate normally. If  however, a woman can express colostrum, it is one indication that the breasts are functioning and are able to produce milk. HOME CARE INSTRUCTIONS  If you are concerned about producing enough milk:  In the early days after your baby is born, allow him or her to breastfeed often.  Let your baby feed whenever you observe hunger cues and until he or she falls asleep or releases the breast.  You will be able to tell if your baby is getting enough by counting his wet diapers and bowel movements. In the first few days after birth, 1 to 2 wet diapers a day are adequate. One wet diaper per day of age and 3 bowel movements are considered normal when your milk supply becomes more plentiful. By day 6, you should see 6 to 8 wet diapers and 3 stools per day.  If your baby does not receive enough milk by breastfeeding alone, partial breastfeeding may be possible and can be supplemented with donated milk or formula. Discuss your options with a Lactation Specialist.  Have your baby's weight checked by 1 week of age. SEEK MEDICAL CARE IF:   You have a hard, sore area in your breast.  You develop a fever of 100.5 F  or greater.  You experience flu-like symptoms. Document Released: 06/10/2005 Document Revised: 09/02/2011 Document Reviewed: 01/12/2009 [facility] Patient Information 2014 [place]. Intrauterine Device Information An intrauterine device (IUD) is inserted into your uterus to prevent pregnancy. There are two types of IUDs available:   Copper IUD This type of IUD is wrapped in copper wire and is placed inside the uterus. Copper makes the uterus and fallopian tubes produce a fluid that kills sperm. The copper IUD can stay in place for 10 years.  Hormone IUD This type of IUD contains the hormone progestin (synthetic progesterone). The hormone thickens the cervical mucus and prevents sperm from entering the uterus. It also thins the uterine lining to prevent implantation of a fertilized egg. The  hormone can weaken or kill the sperm that get into the uterus. One type of hormone IUD can stay in place for 5 years, and another type can stay in place for 3 years. Your health care provider will make sure you are a good candidate for a contraceptive IUD. Discuss with your health care provider the possible side effects.  ADVANTAGES OF AN INTRAUTERINE DEVICE  IUDs are highly effective, reversible, long acting, and low maintenance.   There are no estrogen-related side effects.   An IUD can be used when breastfeeding.   IUDs are not associated with weight gain.   The copper IUD works immediately after insertion.   The hormone IUD works right away if inserted within 7 days of your period starting. You will need to use a backup method of birth control for 7 days if the hormone IUD is inserted at any other time in your cycle.  The copper IUD does not interfere with your female hormones.   The hormone IUD can make heavy menstrual periods lighter and decrease cramping.   The hormone IUD can be used for 3 or 5 years.   The copper IUD can be used for 10 years. DISADVANTAGES OF AN INTRAUTERINE DEVICE  The hormone IUD can be associated with irregular bleeding patterns.   The copper IUD can make your menstrual flow heavier and more painful.   You may experience cramping and vaginal bleeding after insertion.  Document Released: 05/14/2004 Document Revised: 02/10/2013 Document Reviewed: 11/29/2012 [facility] Patient Information 2014 [place], [place].

## 2013-09-01 NOTE — Progress Notes (Signed)
CSW assessment completed. No barriers to discharge. Full assessment to follow.      

## 2013-09-01 NOTE — Discharge Summary (Signed)
Obstetric Discharge Summary Reason for Admission: onset of labor Prenatal Procedures: ultrasound Intrapartum Procedures: spontaneous vaginal delivery and mild shouder dystocia, posterior shoulder delivered first Postpartum Procedures: none Complications-Operative and Postpartum: Dystocia as noted above and in delivery note Hemoglobin  Date Value Ref Range Status  08/31/2013 9.8* 12.0 - 15.0 g/dL Final     HCT  Date Value Ref Range Status  08/31/2013 28.2* 36.0 - 46.0 % Final    Physical Exam:  General: alert, cooperative and no distress Lochia: appropriate Uterine Fundus: firm Incision: N/A DVT Evaluation: No evidence of DVT seen on physical exam.  Discharge Diagnoses: Term Pregnancy-delivered  Discharge Information: Date: 09/01/2013 Activity: pelvic rest Diet: routine Medications: Ibuprofen Condition: stable Instructions: refer to practice specific booklet Discharge to: home Follow-up Information   [facility] (Discuss Depo shot for contraception)    Contact information:   [address] [place] [id number] [phone]     At 12:19 AM a viable female was delivered via Vaginal, Spontaneous Delivery.  Presentation: vertex; Position: Left,, Occiput,, Anterior; Station: +3.  Delivery of the head: 08/31/2013 12:18 AM First maneuver: 08/31/2013 12:18 AM, McRoberts Second maneuver: 08/31/2013 12:18 AM, Suprapubic Pressure Third maneuver: 08/31/2013 12:19,  Delivery of posterior arm  Verbal consent: unable to obtain verbal consent due to emergent nature of delivery.  APGAR: 8, 9; weight .   Placenta status: Intact, Spontaneous.   Cord: 3 vessels with the following complications: None.  Cord pH: Not submitted  Anesthesia: Epidural  Episiotomy: None Lacerations: None Suture Repair: NA Est. Blood Loss (mL): 400  Mom to postpartum.  Baby to Couplet care / Skin to Skin.  Pt pushed for approximately 1 hr. I returned to check on  patient, and found to have caput and perineum. Dr. Lolita LenzJoiner pushed with patient to delivery of head while pt was in modified sims on right side. Unable to deliver anterior shoulder at that time. I exchanged positions with Dr. Lolita LenzJoiner, Assistance from additional nurses and patient was placed in Mcroberts position with suprapubic pressure. Posterior hand presenting, and delivered. Remainder of infant followed with delivery of posterior arm. Infant immediately started crying and was placed on maternal abdomen. Father cut cord, Active management of third stage with pit and traction delivered intact placenta with 3v cord. No tears. EBL 400, hemostatic at completion. Counts correct.  This was a minor shoulder dystocia, and it would be reasonable for patient to trial labor with infant of similar size.   Stacey Rhodes, Stacey Rhodes 08/31/2013, 12:51 AM  Hospital Course:  Ms. Stacey DauerBarnes presented for onset of labor and labored for approximately 1 hour in the hospital before giving birth to a viable baby girl. She is meeting discharge criteria at time of discharge. Breast feeding, and wishes to have depot shot for contraception.   Newborn Data: Live born female  Birth Weight: 8 lb 13.1 oz (4000 g) APGAR: 8, 9  Home with mother.  Stacey Rhodes, Stacey Rhodes 09/01/2013, 7:50 AM  I have seen the patient with the resident/student and agree with the above.  Stacey Rhodes, Stacey Rhodes

## 2013-09-02 NOTE — Discharge Summary (Signed)
Attestation of Attending Supervision of Advanced Practitioner (CNM/NP): Evaluation and management procedures were performed by the Advanced Practitioner under my supervision and collaboration.  I have reviewed the Advanced Practitioner's note and chart, and I agree with the management and plan.  HARRAWAY-SMITH, Hanako Tipping 1:12 PM
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
## 2013-10-08 ENCOUNTER — Encounter: Payer: Self-pay | Admitting: Medical

## 2013-10-08 ENCOUNTER — Ambulatory Visit (INDEPENDENT_AMBULATORY_CARE_PROVIDER_SITE_OTHER): Payer: Federal, State, Local not specified - PPO | Admitting: Medical

## 2013-10-08 VITALS — BP 116/78 | HR 70 | Temp 97.2°F | Ht 67.0 in | Wt 163.8 lb

## 2013-10-08 DIAGNOSIS — Z30017 Encounter for initial prescription of implantable subdermal contraceptive: Secondary | ICD-10-CM

## 2013-10-08 DIAGNOSIS — Z3049 Encounter for surveillance of other contraceptives: Secondary | ICD-10-CM

## 2013-10-08 DIAGNOSIS — Z01812 Encounter for preprocedural laboratory examination: Secondary | ICD-10-CM

## 2013-10-08 LAB — POCT PREGNANCY, URINE: PREG TEST UR: NEGATIVE

## 2013-10-08 MED ORDER — ETONOGESTREL 68 MG ~~LOC~~ IMPL
68.0000 mg | DRUG_IMPLANT | Freq: Once | SUBCUTANEOUS | Status: AC
  Administered 2013-10-08: 68 mg via SUBCUTANEOUS

## 2013-10-08 NOTE — Addendum Note (Signed)
Addended by: Louanna RawAMPBELL, Rakisha Pincock M on: 10/08/2013 11:36 AM   Modules accepted: Orders

## 2013-10-08 NOTE — Progress Notes (Signed)
Pt. Here for PP. Desires nexplanon. Denies having sex/unprotected sex in the last two weeks. UPT negative.

## 2013-10-08 NOTE — Progress Notes (Signed)
Patient ID: Stacey Rhodes, female   DOB: 02-17-1996, 18 y.o.   MRN: [id number] Subjective:     Stacey Rhodes is a 18 y.o. female who presents for a postpartum visit. She is 5 weeks postpartum following a spontaneous vaginal delivery. I have fully reviewed the prenatal and intrapartum course. The delivery was at 41.1 gestational weeks. Outcome: spontaneous vaginal delivery. Anesthesia: epidural. Postpartum course has been Normal. Baby's course has been normal. Baby is feeding by breast. Bleeding staining only. Bowel function is normal. Bladder function is normal. Patient is not sexually active. Contraception method is none. Postpartum depression screening: negative. Patient desires Nexplanon for birth control.   The following portions of the patient's history were reviewed and updated as appropriate: allergies, current medications, past family history, past medical history, past social history, past surgical history and problem list.  Review of Systems Pertinent items are noted in HPI.   Objective:    BP 116/78  Pulse 70  Temp(Src) 97.2 F (36.2 C) (Oral)  Ht 5\' 7"  (1.702 m)  Wt 163 lb 12.8 oz (74.299 kg)  BMI 25.65 kg/m2  Breastfeeding? Yes  General:  alert and cooperative   Breasts:  not performed  Lungs: clear to auscultation bilaterally  Heart:  regular rate and rhythm, S1, S2 normal, no murmur, click, rub or gallop  Abdomen: soft, non-tender; bowel sounds normal; no masses,  no organomegaly   Vulva:  not evaluated  Vagina: not evaluated  Cervix:  not evaluated  Corpus: not examined  Adnexa:  not evaluated  Rectal Exam: Not performed.         GYNECOLOGY CLINIC PROCEDURE NOTE  Nexplanon Insertion Procedure Patient was given informed consent, she signed consent form.  Patient does understand that irregular bleeding is a very common side effect of this medication. She was advised to have backup contraception for one week after placement. Pregnancy test in clinic today was  negative.  Appropriate time out taken.  Patient's left arm was prepped and draped in the usual sterile fashion.. The ruler used to measure and mark insertion area.  Patient was prepped with alcohol swab and then injected with 3 ml of 1% lidocaine.  She was prepped with betadine, Nexplanon removed from packaging,  Device confirmed in needle, then inserted full length of needle and withdrawn per handbook instructions. Nexplanon was able to palpated in the patient's arm; patient palpated the insert herself. There was minimal blood loss.  Patient insertion site covered with guaze and a pressure bandage to reduce any bruising.  The patient tolerated the procedure well and was given post procedure instructions.   Assessment:     Normal postpartum exam. Pap smear not done at today's visit.  Pap smear due at 18 years old  Plan:    1. Contraception: Nexplanon 2. Follow up in: at age 18 years for annual exam or as needed.

## 2013-10-13 ENCOUNTER — Encounter: Payer: Self-pay | Admitting: *Deleted

## 2014-04-25 ENCOUNTER — Encounter: Payer: Self-pay | Admitting: Medical

## 2014-10-25 ENCOUNTER — Emergency Department (HOSPITAL_COMMUNITY)
Admission: EM | Admit: 2014-10-25 | Discharge: 2014-10-26 | Disposition: A | Discharge status: Home / Self Care | Payer: Medicaid Other | Attending: Emergency Medicine | Admitting: Emergency Medicine

## 2014-10-25 ENCOUNTER — Encounter (HOSPITAL_COMMUNITY): Payer: Self-pay | Admitting: Emergency Medicine

## 2014-10-25 DIAGNOSIS — J038 Acute tonsillitis due to other specified organisms: Secondary | ICD-10-CM

## 2014-10-25 DIAGNOSIS — Z8659 Personal history of other mental and behavioral disorders: Secondary | ICD-10-CM | POA: Diagnosis not present

## 2014-10-25 DIAGNOSIS — N390 Urinary tract infection, site not specified: Secondary | ICD-10-CM | POA: Insufficient documentation

## 2014-10-25 DIAGNOSIS — Z3202 Encounter for pregnancy test, result negative: Secondary | ICD-10-CM | POA: Diagnosis not present

## 2014-10-25 DIAGNOSIS — J039 Acute tonsillitis, unspecified: Secondary | ICD-10-CM | POA: Insufficient documentation

## 2014-10-25 DIAGNOSIS — Z79899 Other long term (current) drug therapy: Secondary | ICD-10-CM | POA: Diagnosis not present

## 2014-10-25 DIAGNOSIS — J45909 Unspecified asthma, uncomplicated: Secondary | ICD-10-CM | POA: Insufficient documentation

## 2014-10-25 DIAGNOSIS — B9789 Other viral agents as the cause of diseases classified elsewhere: Secondary | ICD-10-CM

## 2014-10-25 DIAGNOSIS — J029 Acute pharyngitis, unspecified: Secondary | ICD-10-CM | POA: Diagnosis present

## 2014-10-25 LAB — URINALYSIS, ROUTINE W REFLEX MICROSCOPIC
BILIRUBIN URINE: NEGATIVE
Glucose, UA: NEGATIVE mg/dL
KETONES UR: NEGATIVE mg/dL
Nitrite: POSITIVE — AB
PH: 6 (ref 5.0–8.0)
Protein, ur: >300 mg/dL — AB
SPECIFIC GRAVITY, URINE: 1.028 (ref 1.005–1.030)
UROBILINOGEN UA: 1 mg/dL (ref 0.0–1.0)

## 2014-10-25 LAB — URINE MICROSCOPIC-ADD ON

## 2014-10-25 LAB — POC URINE PREG, ED: PREG TEST UR: NEGATIVE

## 2014-10-25 NOTE — ED Notes (Signed)
Pt reports sore throat and painful urination for a week. Pt states OTCs ineffective for sore throat.

## 2014-10-25 NOTE — ED Provider Notes (Signed)
CSN: [id number]     Arrival date & time 10/25/14  2217 History   First MD Initiated Contact with Patient 10/25/14 2310     Chief Complaint  Patient presents with  . Sore Throat  . Dysuria     (Consider location/radiation/quality/duration/timing/severity/associated sxs/prior Treatment) HPI Patient presents with 2 weeks of sore throat. She denies any fever or chills. Denies any difficulty with swallowing though states it is painful. No rhinorrhea or congestion. Denies cough. Denies sick sinus tenderness. That she's been taking over-the-counter ibuprofen with little relief. Her daughters had URI symptoms prior to onset of hers.   Patient also states she's having same painful burning urination for the past week. Admits to urgency and frequency. No flank pain. No abdominal pain. Denies vaginal bleeding or discharge. Past Medical History  Diagnosis Date  . Asthma   . Seasonal allergies   . Depression    Past Surgical History  Procedure Laterality Date  . No past surgeries     Family History  Problem Relation Age of Onset  . Cancer Maternal Grandmother    History  Substance Use Topics  . Smoking status: Never Smoker   . Smokeless tobacco: Never Used  . Alcohol Use: No   OB History    Gravida Para Term Preterm AB TAB SAB Ectopic Multiple Living   1 1 1  0 0 0 0 0 0 1     Review of Systems  Constitutional: Negative for fever and chills.  HENT: Positive for sore throat. Negative for congestion, rhinorrhea, sinus pressure, trouble swallowing and voice change.   Respiratory: Negative for cough and shortness of breath.   Cardiovascular: Negative for chest pain, palpitations and leg swelling.  Gastrointestinal: Negative for nausea, vomiting, abdominal pain, diarrhea and constipation.  Genitourinary: Positive for dysuria. Negative for frequency, hematuria, flank pain, vaginal bleeding, vaginal discharge and pelvic pain.  Musculoskeletal: Negative for back pain, neck pain and neck  stiffness.  Skin: Negative for rash and wound.  Neurological: Negative for dizziness, weakness, light-headedness, numbness and headaches.  All other systems reviewed and are negative.     Allergies  Review of patient's allergies indicates no known allergies.  Home Medications   Prior to Admission medications   Medication Sig Start Date End Date Taking? Authorizing Provider  albuterol (PROVENTIL HFA;VENTOLIN HFA) 108 (90 BASE) MCG/ACT inhaler Inhale into the lungs every 6 (six) hours as needed for wheezing or shortness of breath.   Yes Historical Provider, MD  ibuprofen (ADVIL,MOTRIN) 200 MG tablet Take 600 mg by mouth every 6 (six) hours as needed for moderate pain.   Yes Historical Provider, MD  cephALEXin (KEFLEX) 250 MG capsule Take 1 capsule (250 mg total) by mouth 4 (four) times daily. 10/26/14   Loren Raceravid Nizar Cutler, MD  ibuprofen (ADVIL,MOTRIN) 600 MG tablet Take 1 tablet (600 mg total) by mouth every 6 (six) hours. Patient not taking: Reported on 10/26/2014 09/01/13   Sandrea MatteJonathan Hall, MD   BP 110/68 mmHg  Pulse 85  Temp(Src) 98.5 F (36.9 C) (Oral)  Resp 14  Wt 175 lb (79.379 kg)  SpO2 99%  LMP 10/21/2014 Physical Exam  Constitutional: She is oriented to person, place, and time. She appears well-developed and well-nourished. No distress.  HENT:  Head: Normocephalic and atraumatic.  Mouth/Throat: Oropharynx is clear and moist. No oropharyngeal exudate.  Bilateral tonsillar hypertrophy with erythema. Uvula is midline. No definite exudates.  Eyes: EOM are normal. Pupils are equal, round, and reactive to light.  Neck: Normal range of motion.  Neck supple.  No meningismus  Cardiovascular: Normal rate and regular rhythm.   Pulmonary/Chest: Effort normal and breath sounds normal. No respiratory distress. She has no wheezes. She has no rales.  Abdominal: Soft. Bowel sounds are normal. She exhibits no distension and no mass. There is no tenderness. There is no rebound and no guarding.   Musculoskeletal: Normal range of motion. She exhibits no edema or tenderness.  No CVA tenderness bilaterally.  Lymphadenopathy:    She has cervical adenopathy (anterior cervical adenopathy).  Neurological: She is alert and oriented to person, place, and time.  Skin: Skin is warm and dry. No rash noted. No erythema.  Psychiatric: She has a normal mood and affect. Her behavior is normal.  Nursing note and vitals reviewed.   ED Course  Procedures (including critical care time) Labs Review Labs Reviewed  URINALYSIS, ROUTINE W REFLEX MICROSCOPIC - Abnormal; Notable for the following:    APPearance TURBID (*)    Hgb urine dipstick LARGE (*)    Protein, ur >300 (*)    Nitrite POSITIVE (*)    Leukocytes, UA MODERATE (*)    All other components within normal limits  RAPID STREP SCREEN  CULTURE, GROUP A STREP  MONONUCLEOSIS SCREEN  URINE MICROSCOPIC-ADD ON  POC URINE PREG, ED    Imaging Review No results found.   EKG Interpretation None      MDM   Final diagnoses:  Viral tonsillitis  UTI (lower urinary tract infection)    No respiratory distress. Advise over-the-counter Chloraseptic and ibuprofen for pain control. We'll treat urinary tract infection with Keflex. Though I suspect her tonsillitis is viral in origin we'll also cover bacterial causes for tonsillitis. She's been given return precautions and is voiced understanding.    Loren Racer, MD 10/26/14 [phone]

## 2014-10-25 NOTE — ED Notes (Signed)
Pt reports sore throat and dysuria x 2 weeks.  Has tried taking OTC meds without relief.

## 2014-10-26 LAB — MONONUCLEOSIS SCREEN: Mono Screen: NEGATIVE

## 2014-10-26 LAB — RAPID STREP SCREEN (MED CTR MEBANE ONLY): STREPTOCOCCUS, GROUP A SCREEN (DIRECT): NEGATIVE

## 2014-10-26 MED ORDER — CEPHALEXIN 250 MG PO CAPS: 250.0000 mg | ORAL_CAPSULE | Freq: Four times a day (QID) | ORAL | Status: DC

## 2014-10-26 NOTE — Discharge Instructions (Signed)
Tonsillitis Tonsillitis is an infection of the throat that causes the tonsils to become red, tender, and swollen. Tonsils are collections of lymphoid tissue at the back of the throat. Each tonsil has crevices (crypts). Tonsils help fight nose and throat infections and keep infection from spreading to other parts of the body for the first 18 months of life.  CAUSES Sudden (acute) tonsillitis is usually caused by infection with streptococcal bacteria. Long-lasting (chronic) tonsillitis occurs when the crypts of the tonsils become filled with pieces of food and bacteria, which makes it easy for the tonsils to become repeatedly infected. SYMPTOMS  Symptoms of tonsillitis include:  A sore throat, with possible difficulty swallowing.  White patches on the tonsils.  Fever.  Tiredness.  New episodes of snoring during sleep, when you did not snore before.  Small, foul-smelling, yellowish-white pieces of material (tonsilloliths) that you occasionally cough up or spit out. The tonsilloliths can also cause you to have bad breath. DIAGNOSIS Tonsillitis can be diagnosed through a physical exam. Diagnosis can be confirmed with the results of lab tests, including a throat culture. TREATMENT  The goals of tonsillitis treatment include the reduction of the severity and duration of symptoms and prevention of associated conditions. Symptoms of tonsillitis can be improved with the use of steroids to reduce the swelling. Tonsillitis caused by bacteria can be treated with antibiotic medicines. Usually, treatment with antibiotic medicines is started before the cause of the tonsillitis is known. However, if it is determined that the cause is not bacterial, antibiotic medicines will not treat the tonsillitis. If attacks of tonsillitis are severe and frequent, your health care provider may recommend surgery to remove the tonsils (tonsillectomy). HOME CARE INSTRUCTIONS   Rest as much as possible and get plenty of  sleep.  Drink plenty of fluids. While the throat is very sore, eat soft foods or liquids, such as sherbet, soups, or instant breakfast drinks.  Eat frozen ice pops.  Gargle with a warm or cold liquid to help soothe the throat. Mix 1/4 teaspoon of salt and 1/4 teaspoon of baking soda in 8 oz of water. SEEK MEDICAL CARE IF:   Large, tender lumps develop in your neck.  A rash develops.  A green, yellow-brown, or bloody substance is coughed up.  You are unable to swallow liquids or food for 24 hours.  You notice that only one of the tonsils is swollen. SEEK IMMEDIATE MEDICAL CARE IF:   You develop any new symptoms such as vomiting, severe headache, stiff neck, chest pain, or trouble breathing or swallowing.  You have severe throat pain along with drooling or voice changes.  You have severe pain, unrelieved with recommended medications.  You are unable to fully open the mouth.  You develop redness, swelling, or severe pain anywhere in the neck.  You have a fever. MAKE SURE YOU:   Understand these instructions.  Will watch your condition.  Will get help right away if you are not doing well or get worse. Document Released: 03/20/2005 Document Revised: 10/25/2013 Document Reviewed: 11/27/2012 [facility] Patient Information 2015 [employer] [place]. This information is not intended to replace advice given to you by your health care provider. Make sure you discuss any questions you have with your health care provider.  Urinary Tract Infection Urinary tract infections (UTIs) can develop anywhere along your urinary tract. Your urinary tract is your body's drainage system for removing wastes and extra water. Your urinary tract includes two kidneys, two ureters, a bladder, and a urethra. Your  kidneys are a pair of bean-shaped organs. Each kidney is about the size of your fist. They are located below your ribs, one on each side of your spine. CAUSES Infections are caused by microbes, which are  microscopic organisms, including fungi, viruses, and bacteria. These organisms are so small that they can only be seen through a microscope. Bacteria are the microbes that most commonly cause UTIs. SYMPTOMS  Symptoms of UTIs may vary by age and gender of the patient and by the location of the infection. Symptoms in young women typically include a frequent and intense urge to urinate and a painful, burning feeling in the bladder or urethra during urination. Older women and men are more likely to be tired, shaky, and weak and have muscle aches and abdominal pain. A fever may mean the infection is in your kidneys. Other symptoms of a kidney infection include pain in your back or sides below the ribs, nausea, and vomiting. DIAGNOSIS To diagnose a UTI, your caregiver will ask you about your symptoms. Your caregiver also will ask to provide a urine sample. The urine sample will be tested for bacteria and white blood cells. White blood cells are made by your body to help fight infection. TREATMENT  Typically, UTIs can be treated with medication. Because most UTIs are caused by a bacterial infection, they usually can be treated with the use of antibiotics. The choice of antibiotic and length of treatment depend on your symptoms and the type of bacteria causing your infection. HOME CARE INSTRUCTIONS  If you were prescribed antibiotics, take them exactly as your caregiver instructs you. Finish the medication even if you feel better after you have only taken some of the medication.  Drink enough water and fluids to keep your urine clear or pale yellow.  Avoid caffeine, tea, and carbonated beverages. They tend to irritate your bladder.  Empty your bladder often. Avoid holding urine for long periods of time.  Empty your bladder before and after sexual intercourse.  After a bowel movement, women should cleanse from front to back. Use each tissue only once. SEEK MEDICAL CARE IF:   You have back pain.  You  develop a fever.  Your symptoms do not begin to resolve within 3 days. SEEK IMMEDIATE MEDICAL CARE IF:   You have severe back pain or lower abdominal pain.  You develop chills.  You have nausea or vomiting.  You have continued burning or discomfort with urination. MAKE SURE YOU:   Understand these instructions.  Will watch your condition.  Will get help right away if you are not doing well or get worse. Document Released: 03/20/2005 Document Revised: 12/10/2011 Document Reviewed: 07/19/2011 [facility] Patient Information 2015 [employer], [place]. This information is not intended to replace advice given to you by your health care provider. Make sure you discuss any questions you have with your health care provider.

## 2014-10-28 LAB — CULTURE, GROUP A STREP: STREP A CULTURE: NEGATIVE
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
## 2015-03-02 IMAGING — US US OB LIMITED
1 series · 14 of 24 positions shown · non-contrast
Comparison: none

CLINICAL DATA: Near-syncopal episode.

LIMITED OBSTETRIC ULTRASOUND
Number of Fetuses: 1
Heart Rate: 163 bpm
Movement: Yes
Breathing:
Presentation: Breech the
Placental Location: Anterior
Previa: No
Amniotic Fluid (Subjective): Normal
BPD:  3.0 cm      15 w  3 d         EDC:  08/23/13
MATERNAL FINDINGS:
Cervix: Closed

[Series 1: us ob limited · 0.25mm/px · 14 of 24 slices shown]
[im 1/24]
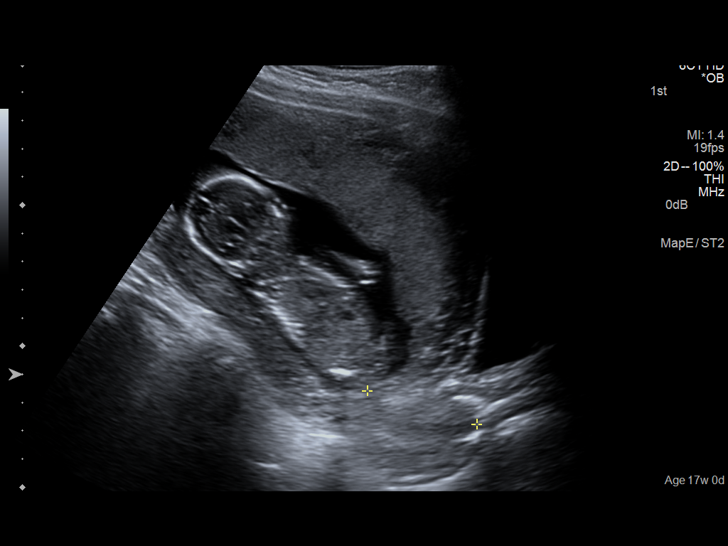
[im 3/24]
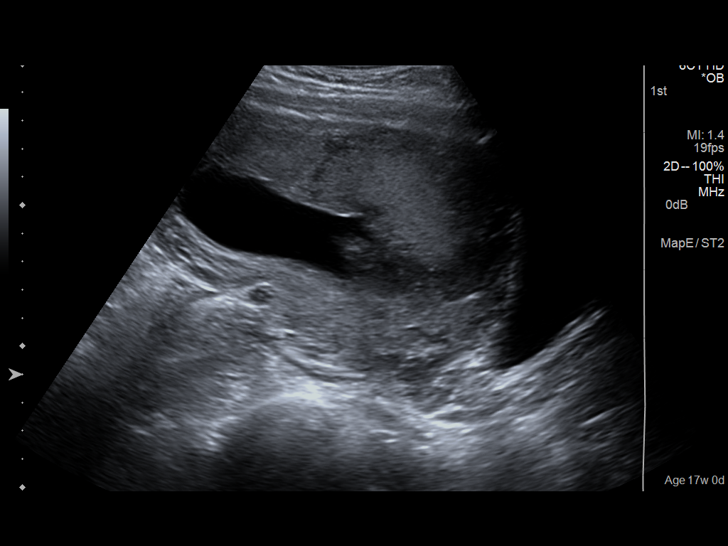
[im 5/24]
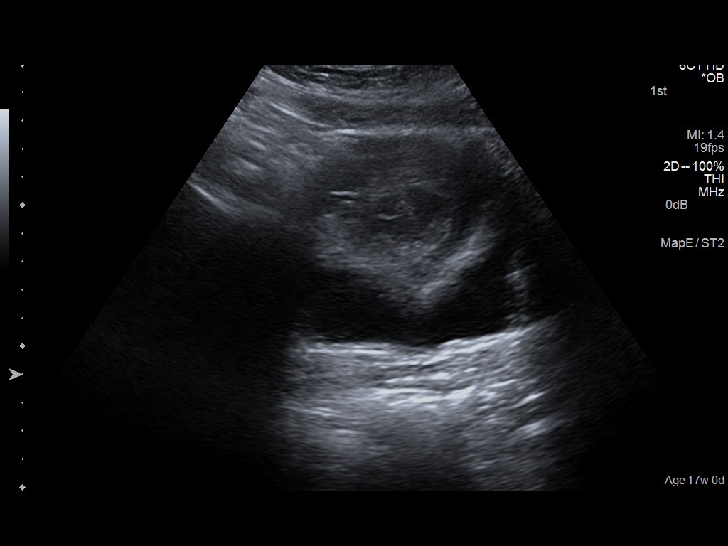
[im 7/24]
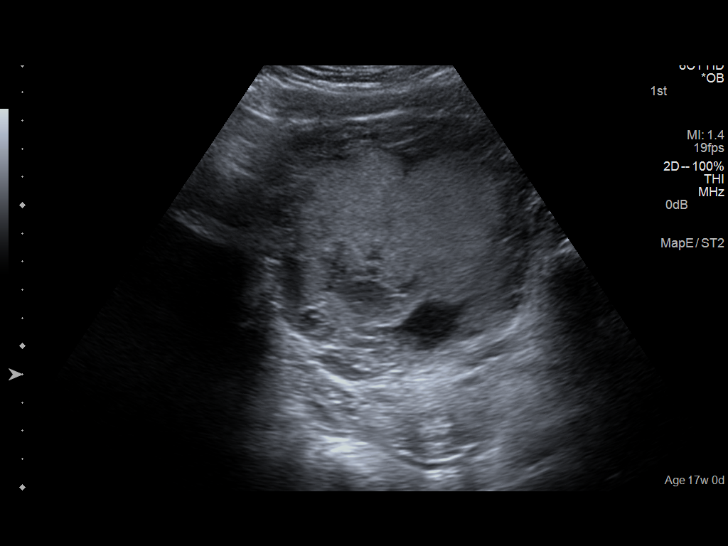
[im 8/24]
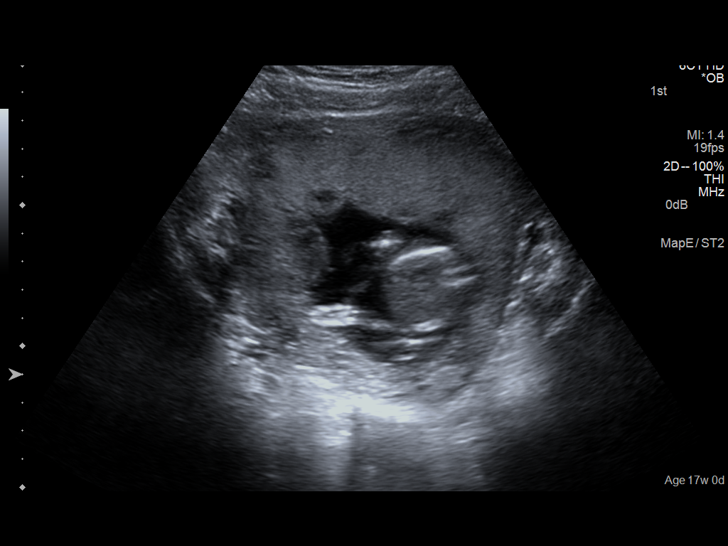
[im 10/24]
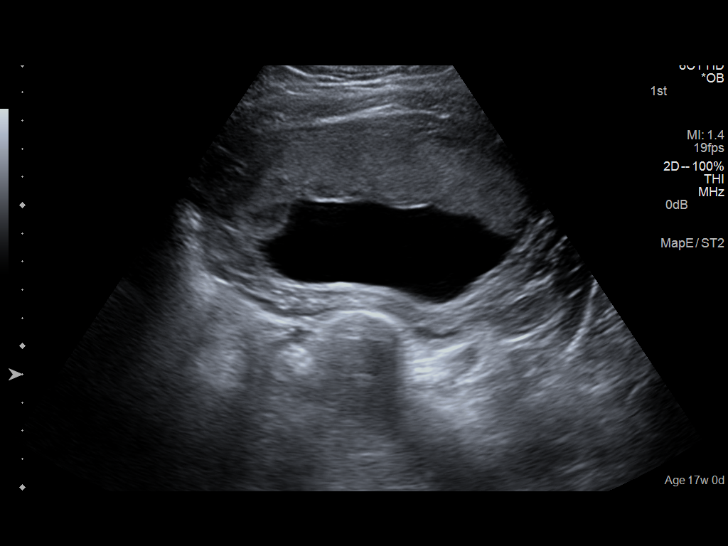
[im 12/24]
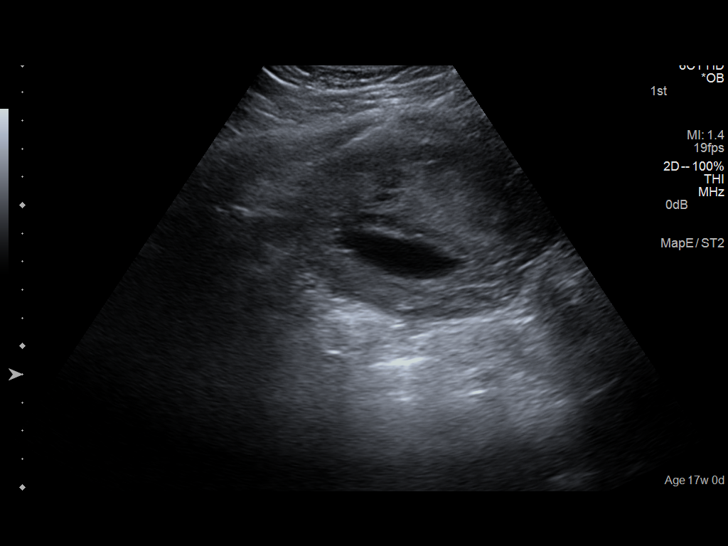
[im 13/24]
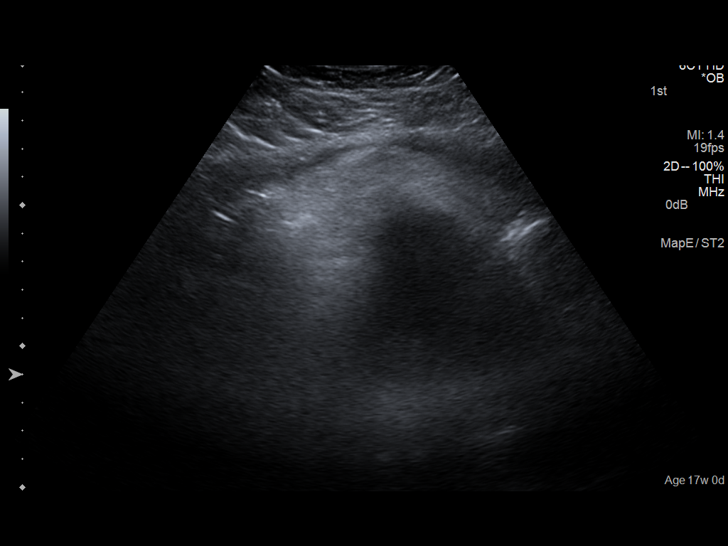
[im 15/24]
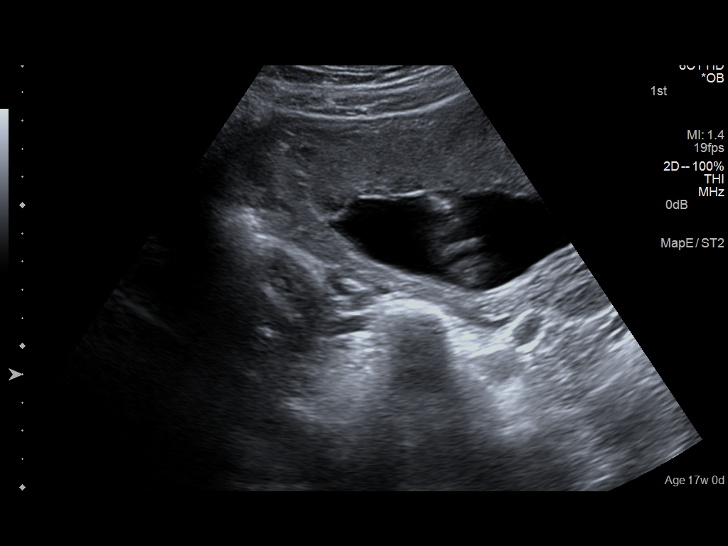
[im 17/24]
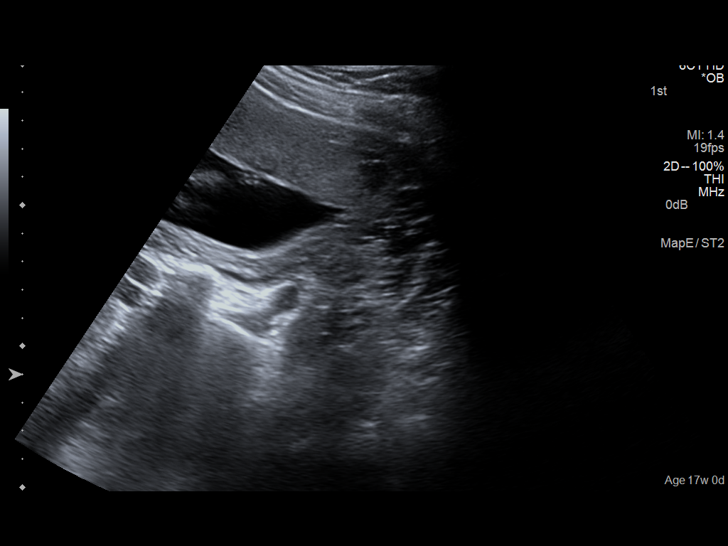
[im 19/24]
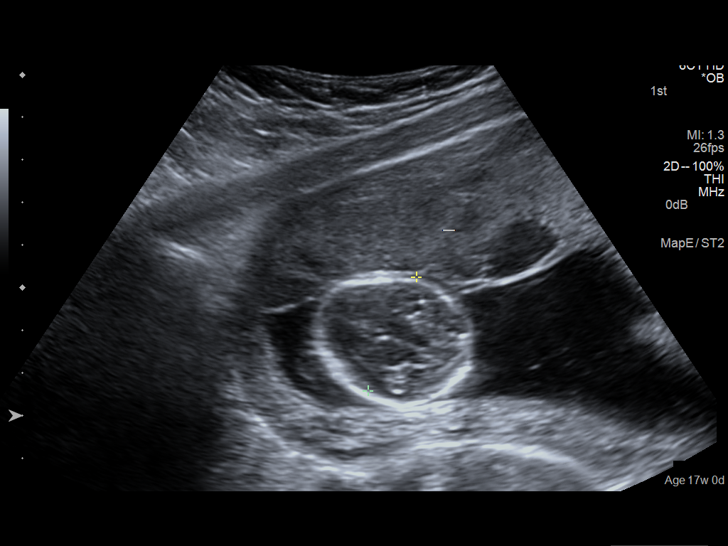
[im 20/24]
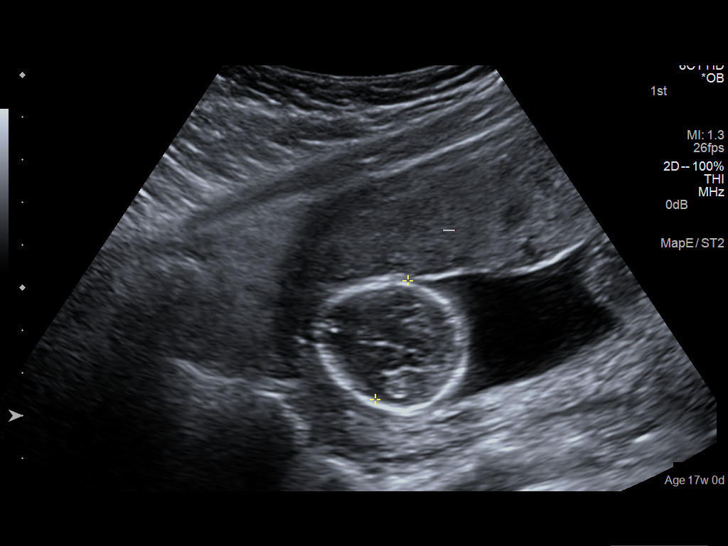
[im 22/24]
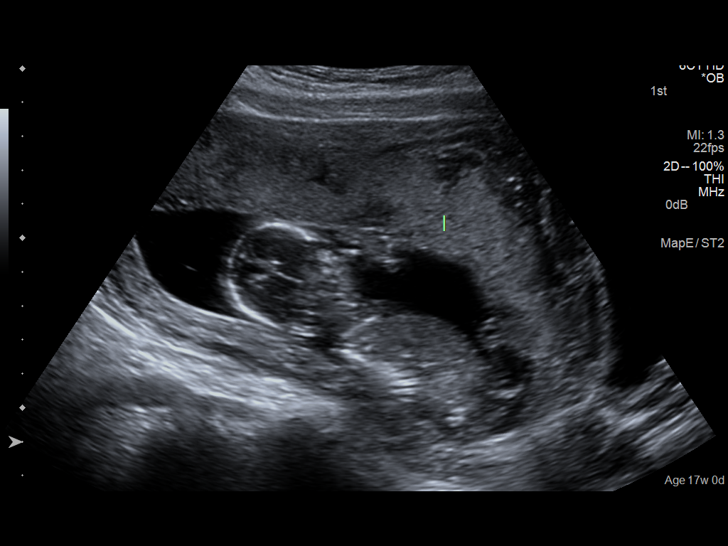
[im 24/24]
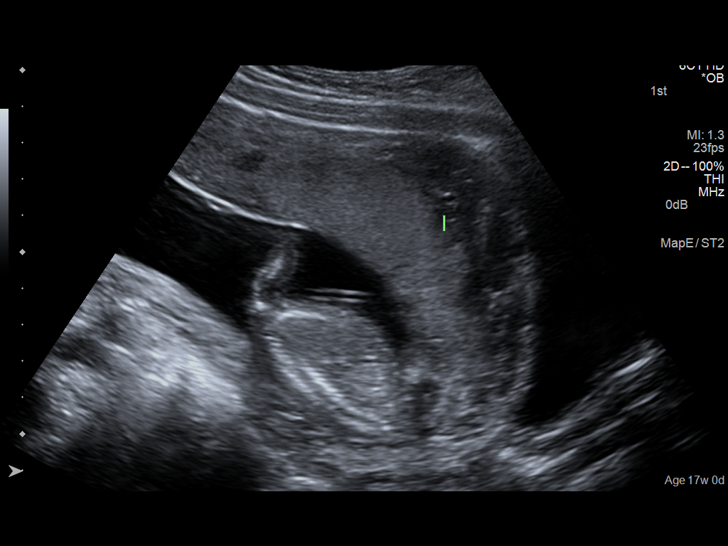

[14 of 24 positions shown; findings below may reference images not displayed]

IMPRESSION: 1.  Single living intrauterine gestation.
2.  Estimated gestational age is 15 weeks and 3 days.
3.  No complications identified.

Recommend followup with non-emergent complete OB 14+ wk US
examination for fetal biometric evaluation and anatomic survey if
not already performed.

## 2015-04-20 IMAGING — US US OB COMP +14 WK
2 series · 12 of 28 positions shown · non-contrast
Comparison: none

[Series 1: us ob comp +14 wk · 3 of 16 slices shown (1 of 2)]
[im 4/16]
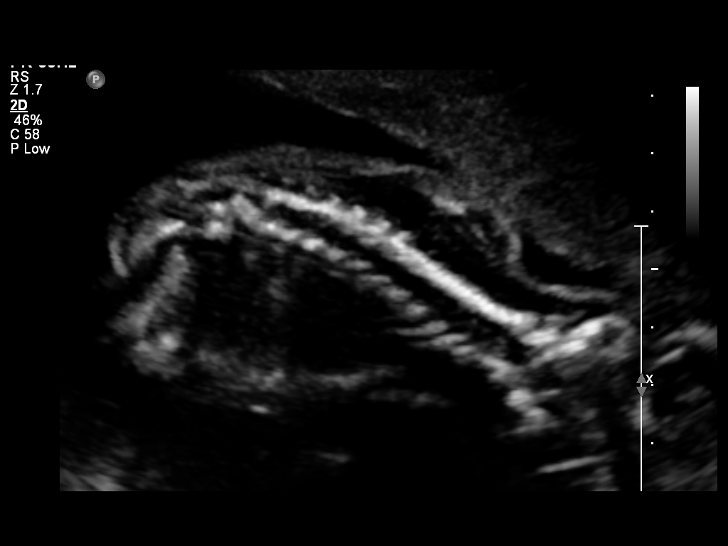
[im 10/16]
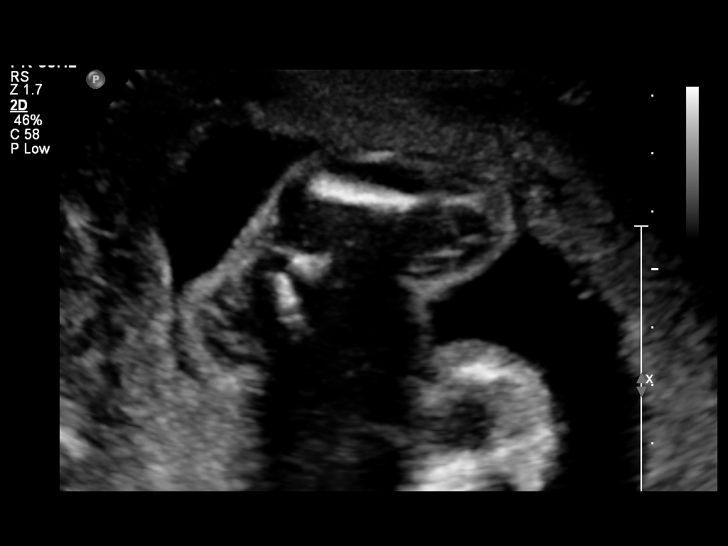
[im 16/16]
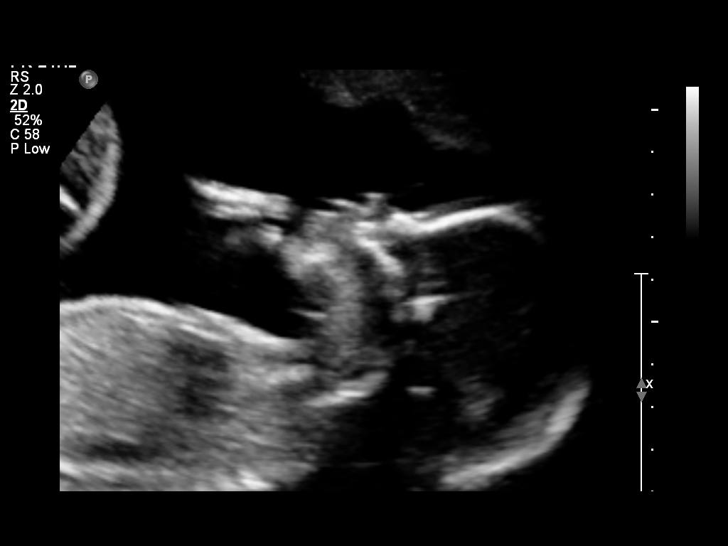

[Series 1: us ob comp +14 wk · 9 of 59 slices shown (2 of 2)]
[im 6/59]
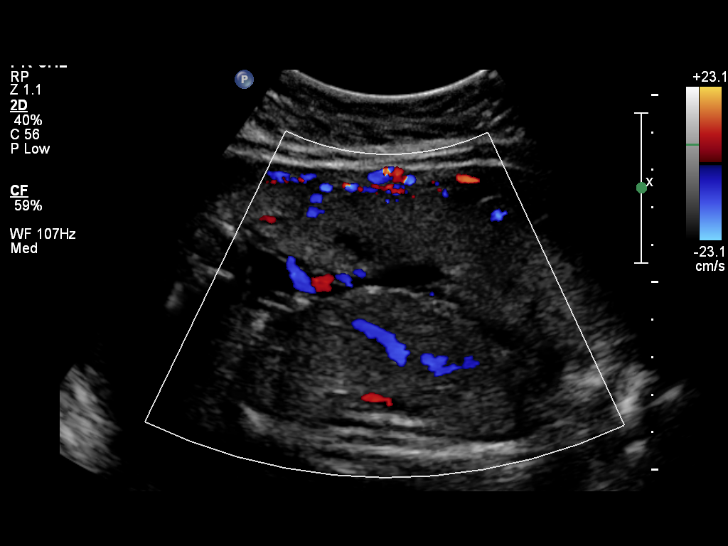
[im 12/59]
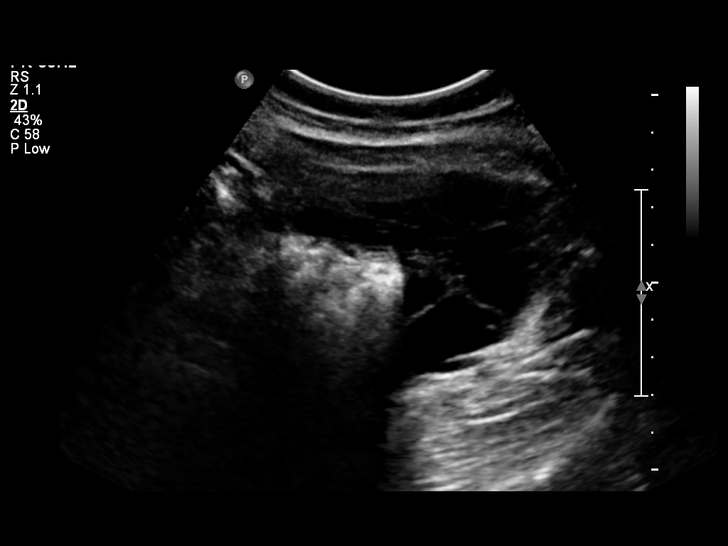
[im 17/59]
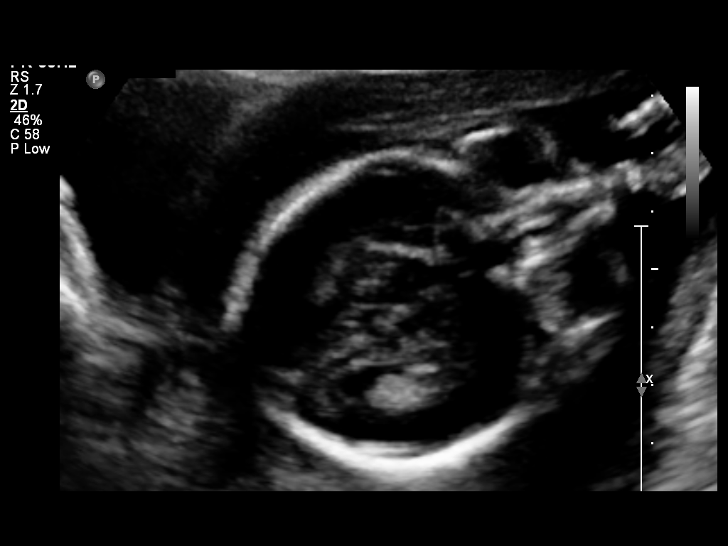
[im 25/59]
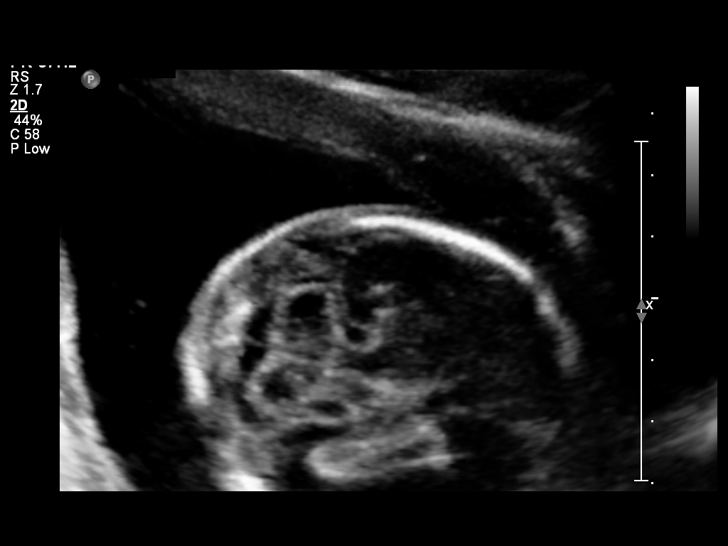
[im 31/59]
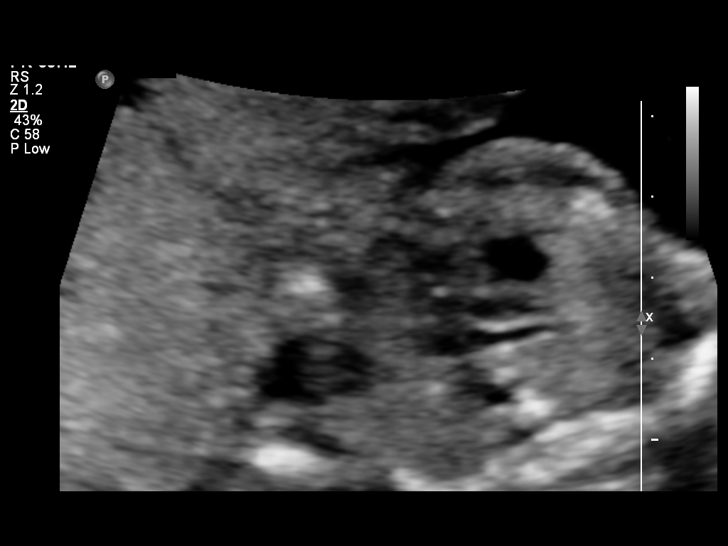
[im 36/59]
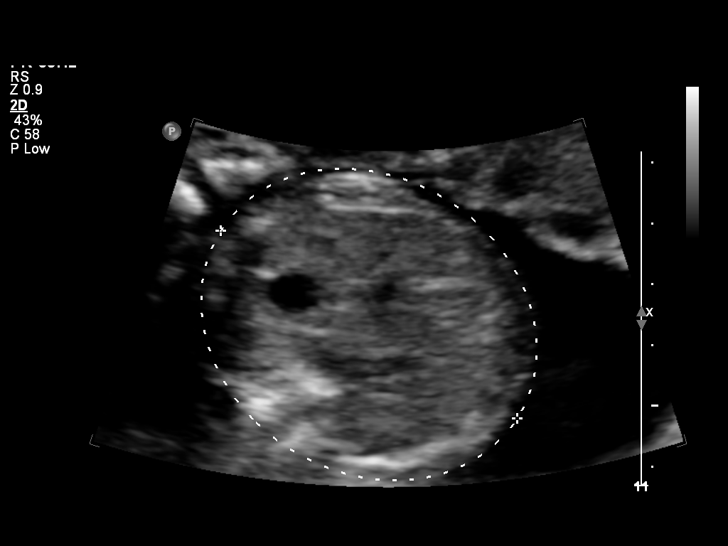
[im 45/59]
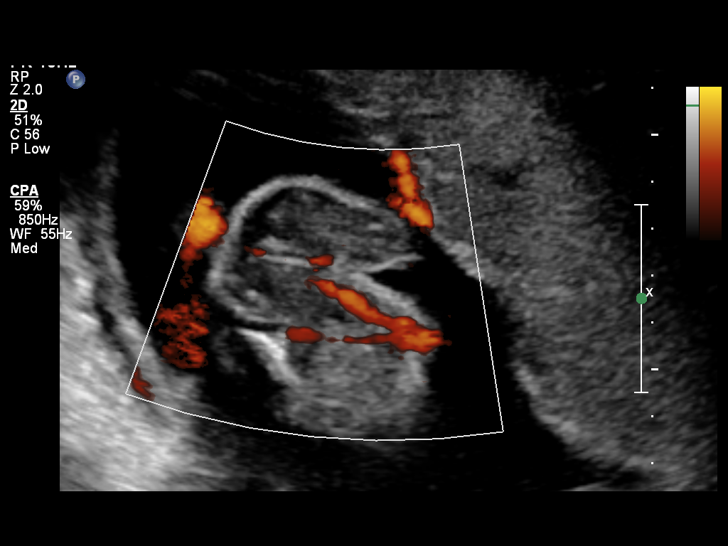
[im 50/59]
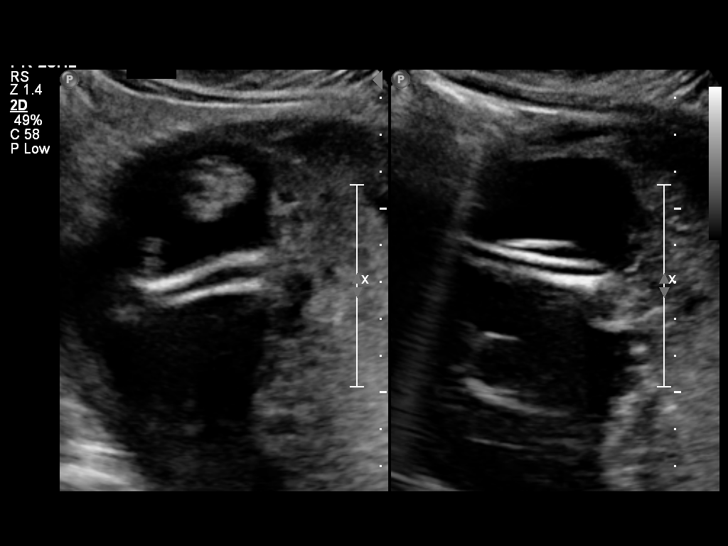
[im 56/59]
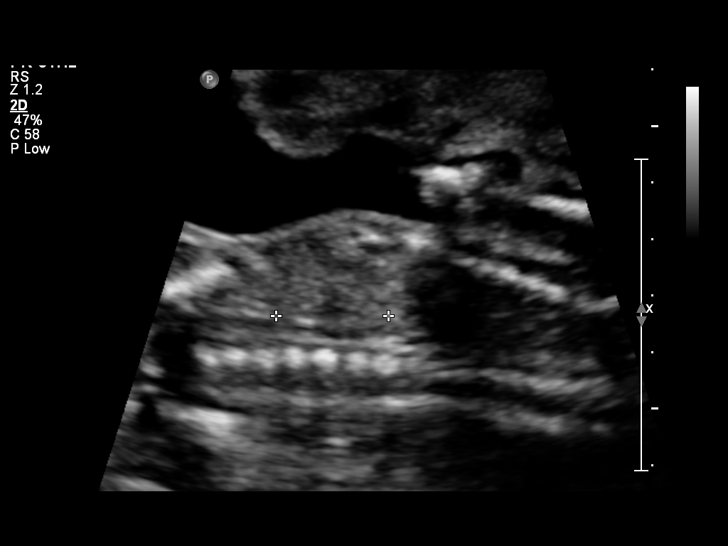

[12 of 28 positions shown; findings below may reference images not displayed]

OBSTETRICS REPORT
                      (Signed Final 04/22/2013 [DATE])

Service(s) Provided

 US OB COMP + 14 WK                                    76805.1
Indications

 No or Little Prenatal Care
 Basic anatomic survey
 Asthma
Fetal Evaluation

 Num Of Fetuses:    1
 Fetal Heart Rate:  143                          bpm
 Cardiac Activity:  Observed
 Presentation:      Cephalic
 Placenta:          Anterior, above cervical os
 P. Cord            Visualized, central
 Insertion:

 Amniotic Fluid
 AFI FV:      Subjectively within normal limits
                                             Larg Pckt:    6.99  cm
Biometry

 BPD:     53.8  mm     G. Age:  22w 2d                CI:        75.66   70 - 86
                                                      FL/HC:      19.1   18.4 -

 HC:     196.1  mm     G. Age:  21w 6d       44  %    HC/AC:      1.18   1.06 -

 AC:     166.2  mm     G. Age:  21w 4d       41  %    FL/BPD:     69.5   71 - 87
 FL:      37.4  mm     G. Age:  22w 0d       48  %    FL/AC:      22.5   20 - 24
 CER:       24  mm     G. Age:  22w 0d       59  %

 Est. FW:     452  gm           1 lb     47  %
Gestational Age

 U/S Today:     22w 0d                                        EDD:   08/26/13
 Best:          21w 5d     Det. By:  Early Ultrasound         EDD:   08/28/13
Anatomy

 Cranium:          Appears normal         Aortic Arch:      Appears normal
 Fetal Cavum:      Appears normal         Ductal Arch:      Appears normal
 Ventricles:       Appears normal         Diaphragm:        Appears normal
 Choroid Plexus:   Appears normal         Stomach:          Appears normal, left
                                                            sided
 Cerebellum:       Appears normal         Abdomen:          Appears normal
 Posterior Fossa:  Appears normal         Abdominal Wall:   Not well visualized
 Nuchal Fold:      Not applicable (>20    Cord Vessels:     Appears normal (3
                   wks GA)                                  vessel cord)
 Face:             Appears normal         Kidneys:          Appear normal
                   (orbits and profile)
 Lips:             Appears normal         Bladder:          Appears normal
 Heart:            Appears normal         Spine:            Appears normal
                   (4CH, axis, and
                   situs)
 RVOT:             Appears normal         Lower             Appears normal
                                          Extremities:
 LVOT:             Appears normal         Upper             Appears normal
                                          Extremities:

 Other:  Fetus appears to be a female. Heels visualized. Technically difficult
         due to fetal position.
Targeted Anatomy

 Fetal Central Nervous System
 Lat. Ventricles:  4.7                    Cisterna Magna:
Cervix Uterus Adnexa

 Cervical Length:    3.98     cm

 Cervix:       Normal appearance by transabdominal scan.

 Left Ovary:    Within normal limits.
 Right Ovary:   Not visualized.

 Adnexa:     No abnormality visualized.
Impression

 IUP at 21+5 weeks
 Normal detailed fetal anatomy; CI not optimally visualized but
 abdominal wall defect not seen
 Markers of aneuploidy: none
 Normal amniotic fluid volume
 Measurements consistent with prior US
Recommendations

 Follow-up as clinically indicated

## 2017-05-07 ENCOUNTER — Emergency Department (HOSPITAL_COMMUNITY)
Admission: EM | Admit: 2017-05-07 | Discharge: 2017-05-08 | Disposition: A | Discharge status: Home / Self Care | Payer: Federal, State, Local not specified - PPO | Attending: Emergency Medicine | Admitting: Emergency Medicine

## 2017-05-07 ENCOUNTER — Encounter (HOSPITAL_COMMUNITY): Payer: Self-pay | Admitting: Emergency Medicine

## 2017-05-07 ENCOUNTER — Other Ambulatory Visit: Payer: Self-pay

## 2017-05-07 DIAGNOSIS — H1031 Unspecified acute conjunctivitis, right eye: Secondary | ICD-10-CM | POA: Insufficient documentation

## 2017-05-07 DIAGNOSIS — J45909 Unspecified asthma, uncomplicated: Secondary | ICD-10-CM | POA: Insufficient documentation

## 2017-05-07 DIAGNOSIS — Z79899 Other long term (current) drug therapy: Secondary | ICD-10-CM | POA: Insufficient documentation

## 2017-05-07 MED ORDER — TETRACAINE HCL 0.5 % OP SOLN
2.0000 [drp] | Freq: Once | OPHTHALMIC | Status: AC
  Administered 2017-05-08: 2 [drp] via OPHTHALMIC
  Filled 2017-05-07: qty 4

## 2017-05-07 MED ORDER — FLUORESCEIN SODIUM 1 MG OP STRP
1.0000 | ORAL_STRIP | Freq: Once | OPHTHALMIC | Status: AC
  Administered 2017-05-08: 1 via OPHTHALMIC
  Filled 2017-05-07: qty 1

## 2017-05-07 NOTE — ED Triage Notes (Signed)
Pt presents to ED for assessment of right eye pain x 4 days.  Pt states she used OTC pink-eye relief without relief.  Pt c/o green and yellow discharge.  Denies injury,.

## 2017-05-08 MED ORDER — KETOROLAC TROMETHAMINE 0.5 % OP SOLN
1.0000 [drp] | Freq: Four times a day (QID) | OPHTHALMIC | Status: DC
  Administered 2017-05-08: 1 [drp] via OPHTHALMIC
  Filled 2017-05-08: qty 3

## 2017-05-08 MED ORDER — KETOROLAC TROMETHAMINE 0.5 % OP SOLN
1.0000 [drp] | Freq: Four times a day (QID) | OPHTHALMIC | Status: DC
  Filled 2017-05-08: qty 3

## 2017-05-08 MED ORDER — POLYMYXIN B-TRIMETHOPRIM 10000-0.1 UNIT/ML-% OP SOLN
1.0000 [drp] | OPHTHALMIC | Status: DC
  Administered 2017-05-08: 1 [drp] via OPHTHALMIC
  Filled 2017-05-08: qty 10
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
## 2017-05-08 NOTE — ED Notes (Signed)
Patient able to ambulate independently  

## 2017-05-08 NOTE — Discharge Instructions (Signed)
Continue to use drops as directed. Keep eye clean with warm cloth like you have been doing. If no improvement in the next 48-72 hours, follow-up with eye doctor on call. Return here for new concerns.

## 2018-01-21 ENCOUNTER — Other Ambulatory Visit: Payer: Self-pay | Admitting: Obstetrics and Gynecology

## 2018-01-21 ENCOUNTER — Other Ambulatory Visit (HOSPITAL_COMMUNITY)
Admission: RE | Admit: 2018-01-21 | Discharge: 2018-01-21 | Disposition: A | Discharge status: Home / Self Care | Payer: 59 | Source: Ambulatory Visit | Attending: Obstetrics and Gynecology | Admitting: Obstetrics and Gynecology

## 2018-01-21 DIAGNOSIS — Z01411 Encounter for gynecological examination (general) (routine) with abnormal findings: Secondary | ICD-10-CM | POA: Diagnosis not present

## 2018-01-27 LAB — CYTOLOGY - PAP
DIAGNOSIS: UNDETERMINED — AB
HPV (WINDOPATH): NOT DETECTED

## 2019-01-25 ENCOUNTER — Other Ambulatory Visit: Payer: Self-pay | Admitting: Obstetrics and Gynecology

## 2019-01-25 ENCOUNTER — Other Ambulatory Visit (HOSPITAL_COMMUNITY)
Admission: RE | Admit: 2019-01-25 | Discharge: 2019-01-25 | Disposition: A | Discharge status: Home / Self Care | Payer: 59 | Source: Ambulatory Visit | Attending: Obstetrics and Gynecology | Admitting: Obstetrics and Gynecology

## 2019-01-25 DIAGNOSIS — Z01419 Encounter for gynecological examination (general) (routine) without abnormal findings: Secondary | ICD-10-CM | POA: Diagnosis present

## 2019-01-26 LAB — CYTOLOGY - PAP: Diagnosis: NEGATIVE

## 2021-03-06 DIAGNOSIS — Z20822 Contact with and (suspected) exposure to covid-19: Secondary | ICD-10-CM | POA: Diagnosis not present

## 2021-04-12 DIAGNOSIS — Z01419 Encounter for gynecological examination (general) (routine) without abnormal findings: Secondary | ICD-10-CM | POA: Diagnosis not present

## 2021-04-12 DIAGNOSIS — Z113 Encounter for screening for infections with a predominantly sexual mode of transmission: Secondary | ICD-10-CM | POA: Diagnosis not present

## 2022-03-07 ENCOUNTER — Other Ambulatory Visit: Payer: Self-pay | Admitting: Internal Medicine

## 2022-03-07 DIAGNOSIS — N946 Dysmenorrhea, unspecified: Secondary | ICD-10-CM | POA: Diagnosis not present

## 2022-03-07 DIAGNOSIS — F4322 Adjustment disorder with anxiety: Secondary | ICD-10-CM | POA: Diagnosis not present

## 2022-03-07 DIAGNOSIS — Z13220 Encounter for screening for lipoid disorders: Secondary | ICD-10-CM | POA: Diagnosis not present

## 2022-03-07 DIAGNOSIS — G44229 Chronic tension-type headache, not intractable: Secondary | ICD-10-CM | POA: Diagnosis not present

## 2022-03-07 DIAGNOSIS — Z0000 Encounter for general adult medical examination without abnormal findings: Secondary | ICD-10-CM | POA: Diagnosis not present

## 2022-03-07 DIAGNOSIS — Z131 Encounter for screening for diabetes mellitus: Secondary | ICD-10-CM | POA: Diagnosis not present

## 2022-03-07 DIAGNOSIS — E559 Vitamin D deficiency, unspecified: Secondary | ICD-10-CM | POA: Diagnosis not present

## 2022-03-08 LAB — COMPLETE METABOLIC PANEL WITH GFR
AG Ratio: 1.4 (calc) (ref 1.0–2.5)
ALT: 45 U/L — ABNORMAL HIGH (ref 6–29)
AST: 50 U/L — ABNORMAL HIGH (ref 10–30)
Albumin: 4.6 g/dL (ref 3.6–5.1)
Alkaline phosphatase (APISO): 51 U/L (ref 31–125)
BUN: 13 mg/dL (ref 7–25)
CO2: 22 mmol/L (ref 20–32)
Calcium: 9.7 mg/dL (ref 8.6–10.2)
Chloride: 103 mmol/L (ref 98–110)
Creat: 0.82 mg/dL (ref 0.50–0.96)
Globulin: 3.3 g/dL (calc) (ref 1.9–3.7)
Glucose, Bld: 71 mg/dL (ref 65–99)
Potassium: 4 mmol/L (ref 3.5–5.3)
Sodium: 136 mmol/L (ref 135–146)
Total Bilirubin: 0.6 mg/dL (ref 0.2–1.2)
Total Protein: 7.9 g/dL (ref 6.1–8.1)
eGFR: 100 mL/min/{1.73_m2} (ref >=60)

## 2022-03-08 LAB — CBC
HCT: 36.6 % (ref 35.0–45.0)
Hemoglobin: 12.1 g/dL (ref 11.7–15.5)
MCH: 28.9 pg (ref 27.0–33.0)
MCHC: 33.1 g/dL (ref 32.0–36.0)
MCV: 87.6 fL (ref 80.0–100.0)
MPV: 8.9 fL (ref 7.5–12.5)
Platelets: 388 10*3/uL (ref 140–400)
RBC: 4.18 10*6/uL (ref 3.80–5.10)
RDW: 12.4 % (ref 11.0–15.0)
WBC: 7.2 10*3/uL (ref 3.8–10.8)

## 2022-03-08 LAB — TSH: TSH: 0.95 mIU/L

## 2022-03-08 LAB — LIPID PANEL
Cholesterol: 220 mg/dL — ABNORMAL HIGH (ref <200)
HDL: 63 mg/dL (ref >=50)
LDL Cholesterol (Calc): 138 mg/dL (calc) — ABNORMAL HIGH
Non-HDL Cholesterol (Calc): 157 mg/dL (calc) — ABNORMAL HIGH (ref <130)
Total CHOL/HDL Ratio: 3.5 (calc) (ref <5.0)
Triglycerides: 93 mg/dL (ref <150)

## 2022-03-08 LAB — VITAMIN D 25 HYDROXY (VIT D DEFICIENCY, FRACTURES): Vit D, 25-Hydroxy: 11 ng/mL — ABNORMAL LOW (ref 30–100)

## 2022-03-21 DIAGNOSIS — R7401 Elevation of levels of liver transaminase levels: Secondary | ICD-10-CM | POA: Diagnosis not present

## 2022-03-21 DIAGNOSIS — F4322 Adjustment disorder with anxiety: Secondary | ICD-10-CM | POA: Diagnosis not present

## 2022-05-14 ENCOUNTER — Other Ambulatory Visit: Payer: Self-pay | Admitting: Nurse Practitioner

## 2022-05-14 ENCOUNTER — Other Ambulatory Visit (HOSPITAL_COMMUNITY)
Admission: RE | Admit: 2022-05-14 | Discharge: 2022-05-14 | Disposition: A | Discharge status: Home / Self Care | Payer: BC Managed Care – PPO | Source: Ambulatory Visit | Attending: Nurse Practitioner | Admitting: Nurse Practitioner

## 2022-05-14 DIAGNOSIS — Z124 Encounter for screening for malignant neoplasm of cervix: Secondary | ICD-10-CM | POA: Diagnosis not present

## 2022-05-14 DIAGNOSIS — Z01419 Encounter for gynecological examination (general) (routine) without abnormal findings: Secondary | ICD-10-CM | POA: Diagnosis not present

## 2022-05-21 LAB — CYTOLOGY - PAP
Comment: NEGATIVE
Diagnosis: UNDETERMINED — AB
High risk HPV: NEGATIVE

## 2022-06-28 ENCOUNTER — Ambulatory Visit (INDEPENDENT_AMBULATORY_CARE_PROVIDER_SITE_OTHER): Payer: BC Managed Care – PPO | Admitting: Family Medicine

## 2022-06-28 ENCOUNTER — Encounter: Payer: Self-pay | Admitting: Family Medicine

## 2022-06-28 VITALS — BP 100/68 | HR 85 | Temp 98.0°F | Ht 68.0 in | Wt 185.0 lb

## 2022-06-28 DIAGNOSIS — E559 Vitamin D deficiency, unspecified: Secondary | ICD-10-CM | POA: Diagnosis not present

## 2022-06-28 DIAGNOSIS — R350 Frequency of micturition: Secondary | ICD-10-CM

## 2022-06-28 DIAGNOSIS — N3001 Acute cystitis with hematuria: Secondary | ICD-10-CM

## 2022-06-28 DIAGNOSIS — M545 Low back pain, unspecified: Secondary | ICD-10-CM | POA: Diagnosis not present

## 2022-06-28 LAB — POCT URINALYSIS DIPSTICK
Bilirubin, UA: NEGATIVE
Glucose, UA: NEGATIVE
Ketones, UA: NEGATIVE
Leukocytes, UA: NEGATIVE
Nitrite, UA: NEGATIVE
Protein, UA: POSITIVE — AB
Spec Grav, UA: 1.02 (ref 1.010–1.025)
Urobilinogen, UA: 0.2 E.U./dL
pH, UA: 6 (ref 5.0–8.0)

## 2022-06-28 MED ORDER — VITAMIN D (ERGOCALCIFEROL) 1.25 MG (50000 UNIT) PO CAPS: 50000.0000 [IU] | ORAL_CAPSULE | ORAL | 2 refills | Status: AC

## 2022-06-28 MED ORDER — CIPROFLOXACIN HCL 250 MG PO TABS: 250.0000 mg | ORAL_TABLET | Freq: Two times a day (BID) | ORAL | 0 refills | Status: AC

## 2022-06-28 NOTE — Progress Notes (Unsigned)
New Patient Office Visit  Subjective    Patient ID: Stacey Rhodes, female    DOB: [birth year]  Age: 27 y.o. MRN: [id number]  CC:  Chief Complaint  Patient presents with   Establish Care    HPI [name] presents to establish care Patient is reporting a new history of lower back pain, pt states that it simliar to a previous UTI that she had in the past. States that it is difficult to tell when it started because she also has back pain with her menstrual cycles. No fever/chills. No burning with urination, no odor.  Increased urinary frequency.  Pt reports she does not have any currently diagnosed medical issues. We reviewed her past medical history and her health maintenance measures.   Outpatient Encounter Medications as of 06/28/2022  Medication Sig   APRI 0.15-30 MG-MCG tablet Take 1 tablet by mouth daily.   [EXPIRED] ciprofloxacin (CIPRO) 250 MG tablet Take 1 tablet (250 mg total) by mouth 2 (two) times daily for 3 days.   ibuprofen (ADVIL) 800 MG tablet Take 800 mg by mouth as needed (menstrual cramps).   Vitamin D, Ergocalciferol, (DRISDOL) 1.25 MG (50000 UNIT) CAPS capsule Take 1 capsule (50,000 Units total) by mouth every 7 (seven) days.   cephALEXin (KEFLEX) 250 MG capsule Take 1 capsule (250 mg total) by mouth 4 (four) times daily.   [DISCONTINUED] albuterol (PROVENTIL HFA;VENTOLIN HFA) 108 (90 BASE) MCG/ACT inhaler Inhale into the lungs every 6 (six) hours as needed for wheezing or shortness of breath.   [DISCONTINUED] ibuprofen (ADVIL,MOTRIN) 200 MG tablet Take 600 mg by mouth every 6 (six) hours as needed for moderate pain.   [DISCONTINUED] ibuprofen (ADVIL,MOTRIN) 600 MG tablet Take 1 tablet (600 mg total) by mouth every 6 (six) hours. (Patient not taking: Reported on 10/26/2014)   No facility-administered encounter medications on file as of 06/28/2022.    Past Medical History:  Diagnosis Date   Asthma    Seasonal allergies     Past Surgical History:   Procedure Laterality Date   NO PAST SURGERIES      Family History  Problem Relation Age of Onset   Cancer Maternal Grandmother    CVA Maternal Grandmother     Social History   Socioeconomic History   Marital status: Single    Spouse name: Not on file   Number of children: Not on file   Years of education: Not on file   Highest education level: Not on file  Occupational History   Not on file  Tobacco Use   Smoking status: Never   Smokeless tobacco: Never  Vaping Use   Vaping Use: Former  Substance and Sexual Activity   Alcohol use: No   Drug use: No   Sexual activity: Not Currently    Birth control/protection: None  Other Topics Concern   Not on file  Social History Narrative   Not on file   Social Determinants of Health   Financial Resource Strain: Not on file  Food Insecurity: Not on file  Transportation Needs: Not on file  Physical Activity: Not on file  Stress: Not on file  Social Connections: Not on file  Intimate Partner Violence: Not on file    Review of Systems  All other systems reviewed and are negative.       Objective    BP 100/68 (BP Location: Right Arm, Patient Position: Sitting, Cuff Size: Normal)   Pulse 85   Temp 98 F (36.7 C) (Oral)  Ht 5\' 8"  (1.727 m)   Wt 185 lb (83.9 kg)   LMP 06/07/2022 (Exact Date)   SpO2 97%   BMI 28.13 kg/m   Physical Exam Vitals reviewed.  Constitutional:      Appearance: Normal appearance. She is well-groomed and normal weight.  Cardiovascular:     Rate and Rhythm: Normal rate and regular rhythm.     Pulses: Normal pulses.     Heart sounds: S1 normal and S2 normal.  Pulmonary:     Effort: Pulmonary effort is normal.     Breath sounds: Normal breath sounds and air entry.  Abdominal:     Tenderness: There is no abdominal tenderness. There is no right CVA tenderness or left CVA tenderness.  Neurological:     Mental Status: She is alert and oriented to person, place, and time. Mental status is  at baseline.     Gait: Gait is intact.  Psychiatric:        Mood and Affect: Mood and affect normal.        Speech: Speech normal.        Behavior: Behavior normal.        Judgment: Judgment normal.   Urine dipstick shows negative for all components, positive for red blood cells.   Last CBC Lab Results  Component Value Date   WBC 7.2 03/07/2022   HGB 12.1 03/07/2022   HCT 36.6 03/07/2022   MCV 87.6 03/07/2022   MCH 28.9 03/07/2022   RDW 12.4 03/07/2022   PLT 388 03/07/2022   Last metabolic panel Lab Results  Component Value Date   GLUCOSE 71 03/07/2022   NA 136 03/07/2022   K 4.0 03/07/2022   CL 103 03/07/2022   CO2 22 03/07/2022   BUN 13 03/07/2022   CREATININE 0.82 03/07/2022   EGFR 100 03/07/2022   CALCIUM 9.7 03/07/2022   PROT 7.9 03/07/2022   ALBUMIN 3.5 03/04/2013   BILITOT 0.6 03/07/2022   ALKPHOS 43 03/04/2013   AST 50 (H) 03/07/2022   ALT 45 (H) 03/07/2022   Last lipids Lab Results  Component Value Date   CHOL 220 (H) 03/07/2022   HDL 63 03/07/2022   LDLCALC 138 (H) 03/07/2022   TRIG 93 03/07/2022   CHOLHDL 3.5 03/07/2022   Last vitamin D Lab Results  Component Value Date   VD25OH 11 (L) 03/07/2022        Assessment & Plan:   Problem List Items Addressed This Visit   None Visit Diagnoses     Acute midline low back pain without sciatica    -  Primary   Relevant Medications   Most likely due to UTI, advised she may use 800 mh ibuprofen every 8 hours as needed ibuprofen (ADVIL) 800 MG tablet   Urinary frequency       Relevant Orders   Urine is positive for blood only, however her symptoms are pathognomonic for UTI.  I recommend sending it for urine culture and will start cipro 250 mg BID for 3 days.  POC Urinalysis Dipstick (Completed)   Culture, Urine (Completed)   Acute cystitis with hematuria --see above A and P      Vitamin D deficiency       Relevant Medications   Reviewed her last labs from September 2023, she has a severe vitamin  D deficiency, I will place her on 50k unit capsules once weekly to continue for at least 6 months, then will recheck level.  Vitamin D, Ergocalciferol, (DRISDOL) 1.25 MG (  50000 UNIT) CAPS capsule       Return in about 3 months (around 09/27/2022) for Annual Physical Exam.   Farrel Conners, MD

## 2022-07-01 ENCOUNTER — Telehealth: Payer: Self-pay | Admitting: Family Medicine

## 2022-07-01 LAB — URINE CULTURE
MICRO NUMBER:: 14394365
SPECIMEN QUALITY:: ADEQUATE

## 2022-07-01 NOTE — Telephone Encounter (Signed)
See results note. 

## 2022-07-01 NOTE — Telephone Encounter (Signed)
Patient returned call

## 2022-09-26 ENCOUNTER — Encounter: Payer: BC Managed Care – PPO | Admitting: Family Medicine

## 2022-10-08 ENCOUNTER — Emergency Department (HOSPITAL_BASED_OUTPATIENT_CLINIC_OR_DEPARTMENT_OTHER): Payer: BC Managed Care – PPO

## 2022-10-08 ENCOUNTER — Other Ambulatory Visit: Payer: Self-pay

## 2022-10-08 ENCOUNTER — Encounter (HOSPITAL_BASED_OUTPATIENT_CLINIC_OR_DEPARTMENT_OTHER): Payer: Self-pay | Admitting: Emergency Medicine

## 2022-10-08 ENCOUNTER — Emergency Department (HOSPITAL_BASED_OUTPATIENT_CLINIC_OR_DEPARTMENT_OTHER)
Admission: EM | Admit: 2022-10-08 | Discharge: 2022-10-08 | Disposition: A | Discharge status: Home / Self Care | Payer: BC Managed Care – PPO | Attending: Emergency Medicine | Admitting: Emergency Medicine

## 2022-10-08 DIAGNOSIS — Y9367 Activity, basketball: Secondary | ICD-10-CM | POA: Diagnosis not present

## 2022-10-08 DIAGNOSIS — S86111A Strain of other muscle(s) and tendon(s) of posterior muscle group at lower leg level, right leg, initial encounter: Secondary | ICD-10-CM

## 2022-10-08 DIAGNOSIS — W500XXA Accidental hit or strike by another person, initial encounter: Secondary | ICD-10-CM | POA: Insufficient documentation

## 2022-10-08 DIAGNOSIS — M25571 Pain in right ankle and joints of right foot: Secondary | ICD-10-CM | POA: Diagnosis not present

## 2022-10-08 MED ORDER — IBUPROFEN 400 MG PO TABS
400.0000 mg | ORAL_TABLET | Freq: Once | ORAL | Status: AC | PRN
  Administered 2022-10-08: 400 mg via ORAL
  Filled 2022-10-08: qty 1

## 2022-10-08 NOTE — ED Provider Notes (Signed)
[place] EMERGENCY DEPARTMENT AT [facility] Provider Note   CSN: [id number] Arrival date & time: 10/08/22  1906     History  Chief Complaint  Patient presents with   Ankle Pain    Stacey Rhodes is a 27 y.o. female.   Ankle Pain    Patient presents to the emergency department due to right ankle pain.  Happened while she was playing basketball, she pivoted, started having pain in her ankle and flares around her said they may have heard a pop and somebody may have landed on her ankle.  She has been walking on it but has significant pain and swelling.  He also moves to her calf.  No pain to the knee or other leg.  Home Medications Prior to Admission medications   Medication Sig Start Date End Date Taking? Authorizing Provider  APRI 0.15-30 MG-MCG tablet Take 1 tablet by mouth daily. 06/10/22   [provider]  ibuprofen (ADVIL) 800 MG tablet Take 800 mg by mouth as needed (menstrual cramps).    [provider]  Vitamin D, Ergocalciferol, (DRISDOL) 1.25 MG (50000 UNIT) CAPS capsule Take 1 capsule (50,000 Units total) by mouth every 7 (seven) days. 06/28/22   Karie Georges, MD      Allergies    Patient has no known allergies.    Review of Systems   Review of Systems  Physical Exam Updated Vital Signs BP 123/81   Pulse 78   Temp 98.9 F (37.2 C)   Resp 16   Ht  (1.753 m)   Wt 84.8 kg   LMP 10/06/2022 (Exact Date)   SpO2 100%   BMI 27.62 kg/m  Physical Exam Vitals and nursing note reviewed. Exam conducted with a chaperone present.  Constitutional:      Appearance: Normal appearance.  HENT:     Head: Normocephalic and atraumatic.  Eyes:     General: No scleral icterus.       Right eye: No discharge.        Left eye: No discharge.     Extraocular Movements: Extraocular movements intact.     Pupils: Pupils are equal, round, and reactive to light.  Cardiovascular:     Rate and Rhythm: Normal rate and regular rhythm.      Pulses: Normal pulses.     Heart sounds: Normal heart sounds.     No friction rub. No gallop.  Pulmonary:     Effort: Pulmonary effort is normal. No respiratory distress.     Breath sounds: Normal breath sounds.  Abdominal:     General: Abdomen is flat. Bowel sounds are normal. There is no distension.     Palpations: Abdomen is soft.     Tenderness: There is no abdominal tenderness.  Musculoskeletal:        General: Tenderness present.     Comments: Tenderness over right ankle lateral malleolus and medial malleolus.  She has tenderness to the calf, negative Orland Penman sign.  She is able to plantarflex and dorsiflex.  No tenderness to the dorsum of the foot or the knee.  No pain to the hip.  Skin:    General: Skin is warm and dry.     Capillary Refill: Capillary refill takes less than 2 seconds.     Coloration: Skin is not jaundiced.  Neurological:     Mental Status: She is alert. Mental status is at baseline.     Coordination: Coordination normal.     ED Results /  Procedures / Treatments   Labs (all labs ordered are listed, but only abnormal results are displayed) Labs Reviewed - No data to display  EKG None  Radiology DG Ankle Complete Right  Result Date: 10/08/2022 CLINICAL DATA:  fall and pain EXAM: RIGHT ANKLE - COMPLETE 3+ VIEW COMPARISON:  04/10/2005 FINDINGS: There is no evidence of fracture, dislocation, or joint effusion. There is no evidence of arthropathy or other focal bone abnormality. Interval growth. Soft tissues are unremarkable. IMPRESSION: Negative. Electronically Signed   By: Corlis Leak M.D.   On: 10/08/2022 20:03    Procedures Procedures    Medications Ordered in ED Medications  ibuprofen (ADVIL) tablet 400 mg (400 mg Oral Given 10/08/22 1924)    ED Course/ Medical Decision Making/ A&P                             Medical Decision Making Amount and/or Complexity of Data Reviewed Radiology: ordered.  Risk Prescription drug  management.   This is a 27 year old female presenting to the emergency department due to right ankle pain.  Differential includes fractures, dislocation, strain, tendon rupture.  On exam she is neurovascular tact with cap refill and DP and PT are 2+.  She is able to tolerate passive ROM to the ankle, decreased plantarflexion dorsiflexion.  Negative Thompson sign.  I do not think there is a tendon rupture, x-ray was negative for fracture and there is no obvious dislocation on exam or on x-ray.  Suspect gastrocnemius strain, however follow-up with orthopedic, cam boot provided for support.  Anti-inflammatories advised, patient discharged in stable condition.        Final Clinical Impression(s) / ED Diagnoses Final diagnoses:  Gastrocnemius strain, right, initial encounter    Rx / DC Orders ED Discharge Orders     None         Theron Arista, Cordelia Poche 10/08/22 2228    Ernie Avena, MD 10/08/22 2231

## 2022-10-08 NOTE — ED Triage Notes (Signed)
Pt presents R ankle pain and R calf pain after injury during basketball game. States it felt like someone stepped on her foot as she was changing positions, did not hear a pop but teammates did.   No OTC meds yet.   No previous injuries to this limb.  Limited ROM d/t pain. Ambulates with limp.

## 2022-10-08 NOTE — ED Notes (Signed)
Ice pack provided

## 2022-10-08 NOTE — Discharge Instructions (Signed)
You are seen today in the emergency department due to ankle pain.  The x-ray was negative for fracture, suspect you have a strain of your gastrocnemius muscle which should improve with anti-inflammatories, wear the boot for support until you are followed up with orthopedics.

## 2022-10-09 ENCOUNTER — Telehealth: Payer: Self-pay | Admitting: Family Medicine

## 2022-10-09 DIAGNOSIS — M25571 Pain in right ankle and joints of right foot: Secondary | ICD-10-CM | POA: Diagnosis not present

## 2022-10-09 NOTE — Telephone Encounter (Signed)
Left vm for pt to call back to r/s appt due to provider being out of the office.         

## 2022-10-10 ENCOUNTER — Telehealth: Payer: Self-pay | Admitting: Family Medicine

## 2022-10-10 NOTE — Telephone Encounter (Signed)
Left vm for pt to call back to r/s appt due to provider being out of the office.         

## 2022-10-11 ENCOUNTER — Telehealth: Payer: Self-pay | Admitting: Family Medicine

## 2022-10-11 NOTE — Telephone Encounter (Signed)
Left vm for pt to call back to r/s appt due to provider being out of the office.

## 2022-10-16 ENCOUNTER — Encounter: Payer: BC Managed Care – PPO | Admitting: Family Medicine

## 2022-10-24 DIAGNOSIS — Y999 Unspecified external cause status: Secondary | ICD-10-CM | POA: Diagnosis not present

## 2022-10-24 DIAGNOSIS — S86011A Strain of right Achilles tendon, initial encounter: Secondary | ICD-10-CM | POA: Diagnosis not present

## 2022-10-24 DIAGNOSIS — X58XXXA Exposure to other specified factors, initial encounter: Secondary | ICD-10-CM | POA: Diagnosis not present

## 2022-10-24 DIAGNOSIS — G8918 Other acute postprocedural pain: Secondary | ICD-10-CM | POA: Diagnosis not present

## 2022-11-06 DIAGNOSIS — S86011D Strain of right Achilles tendon, subsequent encounter: Secondary | ICD-10-CM | POA: Diagnosis not present

## 2022-11-11 ENCOUNTER — Encounter: Payer: BC Managed Care – PPO | Admitting: Family Medicine

## 2022-12-09 ENCOUNTER — Encounter: Payer: BC Managed Care – PPO | Admitting: Family Medicine

## 2023-02-13 ENCOUNTER — Other Ambulatory Visit: Payer: Self-pay | Admitting: Internal Medicine

## 2023-02-13 DIAGNOSIS — Z124 Encounter for screening for malignant neoplasm of cervix: Secondary | ICD-10-CM | POA: Diagnosis not present

## 2023-02-13 DIAGNOSIS — Z131 Encounter for screening for diabetes mellitus: Secondary | ICD-10-CM | POA: Diagnosis not present

## 2023-02-13 DIAGNOSIS — E559 Vitamin D deficiency, unspecified: Secondary | ICD-10-CM | POA: Diagnosis not present

## 2023-02-13 DIAGNOSIS — Z0000 Encounter for general adult medical examination without abnormal findings: Secondary | ICD-10-CM | POA: Diagnosis not present

## 2023-02-13 DIAGNOSIS — G44229 Chronic tension-type headache, not intractable: Secondary | ICD-10-CM | POA: Diagnosis not present

## 2023-02-13 DIAGNOSIS — Z13220 Encounter for screening for lipoid disorders: Secondary | ICD-10-CM | POA: Diagnosis not present

## 2023-02-13 DIAGNOSIS — F4322 Adjustment disorder with anxiety: Secondary | ICD-10-CM | POA: Diagnosis not present

## 2023-02-14 LAB — COMPLETE METABOLIC PANEL WITH GFR
AG Ratio: 1.4 (calc) (ref 1.0–2.5)
ALT: 12 U/L (ref 6–29)
AST: 20 U/L (ref 10–30)
Albumin: 4.5 g/dL (ref 3.6–5.1)
Alkaline phosphatase (APISO): 57 U/L (ref 31–125)
BUN: 14 mg/dL (ref 7–25)
CO2: 23 mmol/L (ref 20–32)
Calcium: 9.3 mg/dL (ref 8.6–10.2)
Chloride: 104 mmol/L (ref 98–110)
Creat: 0.76 mg/dL (ref 0.50–0.96)
Globulin: 3.3 g/dL (calc) (ref 1.9–3.7)
Glucose, Bld: 78 mg/dL (ref 65–99)
Potassium: 3.9 mmol/L (ref 3.5–5.3)
Sodium: 137 mmol/L (ref 135–146)
Total Bilirubin: 0.6 mg/dL (ref 0.2–1.2)
Total Protein: 7.8 g/dL (ref 6.1–8.1)
eGFR: 109 mL/min/{1.73_m2} (ref >=60)

## 2023-02-14 LAB — CBC
HCT: 35.4 % (ref 35.0–45.0)
Hemoglobin: 11.9 g/dL (ref 11.7–15.5)
MCH: 29.2 pg (ref 27.0–33.0)
MCHC: 33.6 g/dL (ref 32.0–36.0)
MCV: 87 fL (ref 80.0–100.0)
MPV: 8.8 fL (ref 7.5–12.5)
Platelets: 347 10*3/uL (ref 140–400)
RBC: 4.07 10*6/uL (ref 3.80–5.10)
RDW: 12.3 % (ref 11.0–15.0)
WBC: 5.7 10*3/uL (ref 3.8–10.8)

## 2023-02-14 LAB — LIPID PANEL
Cholesterol: 160 mg/dL (ref <200)
HDL: 53 mg/dL (ref >=50)
LDL Cholesterol (Calc): 94 mg/dL
Non-HDL Cholesterol (Calc): 107 mg/dL (ref <130)
Total CHOL/HDL Ratio: 3 (calc) (ref <5.0)
Triglycerides: 43 mg/dL (ref <150)

## 2023-02-14 LAB — VITAMIN D 25 HYDROXY (VIT D DEFICIENCY, FRACTURES): Vit D, 25-Hydroxy: 19 ng/mL — ABNORMAL LOW (ref 30–100)

## 2023-02-27 DIAGNOSIS — G44229 Chronic tension-type headache, not intractable: Secondary | ICD-10-CM | POA: Diagnosis not present

## 2023-02-27 DIAGNOSIS — F4322 Adjustment disorder with anxiety: Secondary | ICD-10-CM | POA: Diagnosis not present

## 2023-03-12 DIAGNOSIS — Z139 Encounter for screening, unspecified: Secondary | ICD-10-CM | POA: Diagnosis not present

## 2023-03-12 DIAGNOSIS — R351 Nocturia: Secondary | ICD-10-CM | POA: Diagnosis not present

## 2023-03-12 DIAGNOSIS — Z1331 Encounter for screening for depression: Secondary | ICD-10-CM | POA: Diagnosis not present

## 2023-03-12 DIAGNOSIS — Z01419 Encounter for gynecological examination (general) (routine) without abnormal findings: Secondary | ICD-10-CM | POA: Diagnosis not present

## 2023-03-13 DIAGNOSIS — R351 Nocturia: Secondary | ICD-10-CM | POA: Diagnosis not present

## 2023-05-19 DIAGNOSIS — Z01419 Encounter for gynecological examination (general) (routine) without abnormal findings: Secondary | ICD-10-CM | POA: Diagnosis not present

## 2023-08-26 DIAGNOSIS — L700 Acne vulgaris: Secondary | ICD-10-CM | POA: Diagnosis not present

## 2023-08-26 DIAGNOSIS — E559 Vitamin D deficiency, unspecified: Secondary | ICD-10-CM | POA: Diagnosis not present

## 2023-08-26 DIAGNOSIS — G44229 Chronic tension-type headache, not intractable: Secondary | ICD-10-CM | POA: Diagnosis not present

## 2023-08-26 DIAGNOSIS — F4322 Adjustment disorder with anxiety: Secondary | ICD-10-CM | POA: Diagnosis not present

## 2023-09-23 DIAGNOSIS — L700 Acne vulgaris: Secondary | ICD-10-CM | POA: Diagnosis not present

## 2023-09-23 DIAGNOSIS — G44229 Chronic tension-type headache, not intractable: Secondary | ICD-10-CM | POA: Diagnosis not present

## 2023-09-23 DIAGNOSIS — F4322 Adjustment disorder with anxiety: Secondary | ICD-10-CM | POA: Diagnosis not present

## 2024-01-09 DIAGNOSIS — S86012A Strain of left Achilles tendon, initial encounter: Secondary | ICD-10-CM | POA: Diagnosis not present

## 2024-01-12 DIAGNOSIS — M25572 Pain in left ankle and joints of left foot: Secondary | ICD-10-CM | POA: Diagnosis not present

## 2024-01-15 DIAGNOSIS — S86012A Strain of left Achilles tendon, initial encounter: Secondary | ICD-10-CM | POA: Diagnosis not present

## 2024-01-21 ENCOUNTER — Encounter (HOSPITAL_BASED_OUTPATIENT_CLINIC_OR_DEPARTMENT_OTHER): Payer: Self-pay | Admitting: Orthopaedic Surgery

## 2024-01-23 NOTE — Discharge Instructions (Addendum)
 Lillia Mountain, MD [facility]  Please read the following information regarding your care after surgery.  Medications  You only need a prescription for the narcotic pain medicine (ex. oxycodone , Percocet, Norco).  All of the other medicines listed below are available over the counter. ? Aleve 2 pills twice a day for the first 3 days after surgery. ? acetominophen (Tylenol ) 650 mg every 4-6 hours as you need for minor to moderate pain ? oxycodone  as prescribed for severe pain  ? To help prevent blood clots, take aspirin (81 mg) twice daily for at least 42 days after surgery.  You should also get up every hour while you are awake to move around.  Weight Bearing ? Do NOT bear any weight on the operated leg or foot. This means do NOT touch your surgical leg to the ground!  Cast / Splint / Dressing ? If you have a splint, do NOT remove this. Keep your splint, cast or dressing clean and dry.  Don't put anything (coat hanger, pencil, etc) down inside of it.  If it gets wet, call the office immediately to schedule an appointment for a cast change.  Swelling IMPORTANT: It is normal for you to have swelling where you had surgery. To reduce swelling and pain, keep at least 3 pillows under your leg so that your toes are above your nose and your heel is above the level of your hip.  It may be necessary to keep your foot or leg elevated for several weeks.  This is critical to helping your incisions heal and your pain to feel better.  Follow Up Call my office at [phone] when you are discharged from the hospital or surgery center to schedule an appointment to be seen 7-10 days after surgery.  Call my office at [phone] if you develop a fever >101.5 F, nausea, vomiting, bleeding from the surgical site or severe pain.     Post Anesthesia Home Care Instructions  Activity: Get plenty of rest for the remainder of the day. A responsible individual must stay with you for 24 hours following the  procedure.  For the next 24 hours, DO NOT: -Drive a car -Advertising copywriter -Drink alcoholic beverages -Take any medication unless instructed by your physician -Make any legal decisions or sign important papers.  Meals: Start with liquid foods such as gelatin or soup. Progress to regular foods as tolerated. Avoid greasy, spicy, heavy foods. If nausea and/or vomiting occur, drink only clear liquids until the nausea and/or vomiting subsides. Call your physician if vomiting continues.  Special Instructions/Symptoms: Your throat may feel dry or sore from the anesthesia or the breathing tube placed in your throat during surgery. If this causes discomfort, gargle with warm salt water. The discomfort should disappear within 24 hours.  If you had a scopolamine patch placed behind your ear for the management of post- operative nausea and/or vomiting:  1. The medication in the patch is effective for 72 hours, after which it should be removed.  Wrap patch in a tissue and discard in the trash. Wash hands thoroughly with soap and water. 2. You may remove the patch earlier than 72 hours if you experience unpleasant side effects which may include dry mouth, dizziness or visual disturbances. 3. Avoid touching the patch. Wash your hands with soap and water after contact with the patch.  Regional Anesthesia Blocks  1. You may not be able to move or feel the blocked extremity after a regional anesthetic block. This may last may last  from 3-48 hours after placement, but it will go away. The length of time depends on the medication injected and your individual response to the medication. As the nerves start to wake up, you may experience tingling as the movement and feeling returns to your extremity. If the numbness and inability to move your extremity has not gone away after 48 hours, please call your surgeon.   2. The extremity that is blocked will need to be protected until the numbness is gone and the  strength has returned. Because you cannot feel it, you will need to take extra care to avoid injury. Because it may be weak, you may have difficulty moving it or using it. You may not know what position it is in without looking at it while the block is in effect.  3. For blocks in the legs and feet, returning to weight bearing and walking needs to be done carefully. You will need to wait until the numbness is entirely gone and the strength has returned. You should be able to move your leg and foot normally before you try and bear weight or walk. You will need someone to be with you when you first try to ensure you do not fall and possibly risk injury.  4. Bruising and tenderness at the needle site are common side effects and will resolve in a few days.  5. Persistent numbness or new problems with movement should be communicated to the surgeon or the [facility] [phone] [facility] [phone]).     No tylenol  until after 3pm today.

## 2024-01-23 NOTE — H&P (Signed)
 ORTHOPAEDIC SURGERY H&P  Subjective:  The patient presents for left achilles rupture.   Past Medical History:  Diagnosis Date   Asthma    Seasonal allergies     Past Surgical History:  Procedure Laterality Date   ACHILLES TENDON REPAIR Right    may 2024     (Not in an outpatient encounter)    No Known Allergies  Social History   Socioeconomic History   Marital status: Single    Spouse name: Not on file   Number of children: Not on file   Years of education: Not on file   Highest education level: Not on file  Occupational History   Not on file  Tobacco Use   Smoking status: Never   Smokeless tobacco: Never  Vaping Use   Vaping status: Former  Substance and Sexual Activity   Alcohol  use: Yes    Comment: occ   Drug use: No   Sexual activity: Not Currently    Birth control/protection: None  Other Topics Concern   Not on file  Social History Narrative   Not on file   Social Drivers of Health   Financial Resource Strain: Not on file  Food Insecurity: Not on file  Transportation Needs: Not on file  Physical Activity: Not on file  Stress: Not on file  Social Connections: Not on file  Intimate Partner Violence: Not on file     History reviewed. No pertinent family history.   Review of Systems Pertinent items are noted in HPI.  Objective: Vital signs in last 24 hours:    01/21/2024   10:10 AM 10/08/2022   10:09 PM 10/08/2022    7:17 PM  Vitals with BMI  Height 5' 9  5' 9  Weight 183 lbs  187 lbs  BMI 27.01  27.6  Systolic  123 110  Diastolic  81 79  Pulse  78 87      EXAM: General: Well nourished, well developed. Awake, alert and oriented to time, place, person. Normal mood and affect. No apparent distress. Breathing room air.  Operative Lower Extremity: Alignment - Neutral Deformity - None Skin intact Tenderness to palpation - None 0/5 TA, 5/5 PT, GS, Per, EHL, FHL Sensation intact to light touch throughout Palpable DP and PT  pulses Special testing: None  The contralateral foot/ankle was examined for comparison and noted to be neurovascularly intact with no localized deformity, swelling, or tenderness.  Imaging Review All images taken were independently reviewed by me.  Assessment/Plan: The clinical and radiographic findings were reviewed and discussed at length with the patient.  The patient presents for left achilles rupture.  We spoke at length about the natural course of these findings. We discussed nonoperative and operative treatment options in detail.  The risks and benefits were presented and reviewed. The risks due to tendon rerupture, tendon transfer, suture/hardware failure/irritation (or if removing hardware inability to remove part/all of hardware, recurrent instability), new/persistent/recurrent infection, stiffness, nerve/vessel/tendon injury, nonunion/malunion of any fracture, wound healing issues, allograft usage, development of arthritis, failure of this surgery, possibility of external fixation in certain situations, possibility of delayed definitive surgery, need for further surgery, prolonged wound care including further soft tissue coverage procedures, thromboembolic events, anesthesia/medical complications/events perioperatively and beyond, amputation, death among others were discussed. The patient acknowledged the explanation and agreed to proceed with the plan.  Lillia Mountain  Orthopaedic Surgery [facility]

## 2024-01-28 ENCOUNTER — Other Ambulatory Visit: Payer: Self-pay

## 2024-01-28 ENCOUNTER — Ambulatory Visit (HOSPITAL_BASED_OUTPATIENT_CLINIC_OR_DEPARTMENT_OTHER): Admitting: Anesthesiology

## 2024-01-28 ENCOUNTER — Ambulatory Visit (HOSPITAL_BASED_OUTPATIENT_CLINIC_OR_DEPARTMENT_OTHER)
Admission: RE | Admit: 2024-01-28 | Discharge: 2024-01-28 | Disposition: A | Discharge status: Home / Self Care | Attending: Orthopaedic Surgery | Admitting: Orthopaedic Surgery

## 2024-01-28 ENCOUNTER — Encounter (HOSPITAL_BASED_OUTPATIENT_CLINIC_OR_DEPARTMENT_OTHER): Payer: Self-pay | Admitting: Orthopaedic Surgery

## 2024-01-28 ENCOUNTER — Encounter (HOSPITAL_BASED_OUTPATIENT_CLINIC_OR_DEPARTMENT_OTHER)
Admission: RE | Disposition: A | Discharge status: Home / Self Care | Payer: Self-pay | Source: Home / Self Care | Attending: Orthopaedic Surgery

## 2024-01-28 DIAGNOSIS — S86012A Strain of left Achilles tendon, initial encounter: Secondary | ICD-10-CM | POA: Insufficient documentation

## 2024-01-28 DIAGNOSIS — Z87891 Personal history of nicotine dependence: Secondary | ICD-10-CM | POA: Diagnosis not present

## 2024-01-28 DIAGNOSIS — Z01818 Encounter for other preprocedural examination: Secondary | ICD-10-CM

## 2024-01-28 DIAGNOSIS — X58XXXA Exposure to other specified factors, initial encounter: Secondary | ICD-10-CM | POA: Diagnosis not present

## 2024-01-28 DIAGNOSIS — J45909 Unspecified asthma, uncomplicated: Secondary | ICD-10-CM | POA: Insufficient documentation

## 2024-01-28 DIAGNOSIS — G8918 Other acute postprocedural pain: Secondary | ICD-10-CM | POA: Diagnosis not present

## 2024-01-28 HISTORY — PX: ACHILLES TENDON SURGERY: SHX542

## 2024-01-28 LAB — POCT PREGNANCY, URINE: Preg Test, Ur: NEGATIVE

## 2024-01-28 SURGERY — REPAIR, TENDON, ACHILLES
Anesthesia: General | Site: Ankle | Laterality: Left

## 2024-01-28 MED ORDER — LIDOCAINE 2% (20 MG/ML) 5 ML SYRINGE
INTRAMUSCULAR | Status: AC
  Filled 2024-01-28: qty 5

## 2024-01-28 MED ORDER — LACTATED RINGERS IV SOLN: INTRAVENOUS | Status: DC | PRN

## 2024-01-28 MED ORDER — FENTANYL CITRATE (PF) 100 MCG/2ML IJ SOLN
100.0000 ug | Freq: Once | INTRAMUSCULAR | Status: AC
  Administered 2024-01-28: 100 ug via INTRAVENOUS

## 2024-01-28 MED ORDER — KETOROLAC TROMETHAMINE 30 MG/ML IJ SOLN
INTRAMUSCULAR | Status: AC
Start: 2024-01-28 — End: 2024-01-28
  Filled 2024-01-28: qty 1

## 2024-01-28 MED ORDER — OXYCODONE HCL 5 MG PO TABS: 5.0000 mg | ORAL_TABLET | Freq: Once | ORAL | Status: DC | PRN

## 2024-01-28 MED ORDER — ONDANSETRON HCL 4 MG/2ML IJ SOLN
INTRAMUSCULAR | Status: DC | PRN
  Administered 2024-01-28: 4 mg via INTRAVENOUS

## 2024-01-28 MED ORDER — ROCURONIUM BROMIDE 10 MG/ML (PF) SYRINGE
PREFILLED_SYRINGE | INTRAVENOUS | Status: AC
  Filled 2024-01-28: qty 10

## 2024-01-28 MED ORDER — MIDAZOLAM HCL 2 MG/2ML IJ SOLN
INTRAMUSCULAR | Status: AC
  Filled 2024-01-28: qty 2

## 2024-01-28 MED ORDER — HYDROMORPHONE HCL 1 MG/ML IJ SOLN: 0.2500 mg | INTRAMUSCULAR | Status: DC | PRN

## 2024-01-28 MED ORDER — 0.9 % SODIUM CHLORIDE (POUR BTL) OPTIME
TOPICAL | Status: DC | PRN
  Administered 2024-01-28: 1000 mL

## 2024-01-28 MED ORDER — KETOROLAC TROMETHAMINE 30 MG/ML IJ SOLN
INTRAMUSCULAR | Status: DC | PRN
  Administered 2024-01-28: 30 mg via INTRAVENOUS

## 2024-01-28 MED ORDER — MEPERIDINE HCL 25 MG/ML IJ SOLN: 6.2500 mg | INTRAMUSCULAR | Status: DC | PRN

## 2024-01-28 MED ORDER — DEXAMETHASONE SODIUM PHOSPHATE 10 MG/ML IJ SOLN
INTRAMUSCULAR | Status: DC | PRN
  Administered 2024-01-28: 10 mg via INTRAVENOUS

## 2024-01-28 MED ORDER — LIDOCAINE 2% (20 MG/ML) 5 ML SYRINGE
INTRAMUSCULAR | Status: DC | PRN
Start: 2024-01-28 — End: 2024-01-28
  Administered 2024-01-28: 40 mg via INTRAVENOUS

## 2024-01-28 MED ORDER — ONDANSETRON HCL 4 MG/2ML IJ SOLN
INTRAMUSCULAR | Status: AC
  Filled 2024-01-28: qty 2

## 2024-01-28 MED ORDER — ACETAMINOPHEN 10 MG/ML IV SOLN
INTRAVENOUS | Status: DC | PRN
  Administered 2024-01-28: 1000 mg via INTRAVENOUS

## 2024-01-28 MED ORDER — FENTANYL CITRATE (PF) 100 MCG/2ML IJ SOLN
INTRAMUSCULAR | Status: AC
  Filled 2024-01-28: qty 2

## 2024-01-28 MED ORDER — DEXAMETHASONE SODIUM PHOSPHATE 10 MG/ML IJ SOLN
INTRAMUSCULAR | Status: AC
  Filled 2024-01-28: qty 1

## 2024-01-28 MED ORDER — AMISULPRIDE (ANTIEMETIC) 5 MG/2ML IV SOLN: 10.0000 mg | Freq: Once | INTRAVENOUS | Status: DC | PRN

## 2024-01-28 MED ORDER — LACTATED RINGERS IV SOLN: INTRAVENOUS | Status: DC

## 2024-01-28 MED ORDER — GLYCOPYRROLATE PF 0.2 MG/ML IJ SOSY
PREFILLED_SYRINGE | INTRAMUSCULAR | Status: AC
  Filled 2024-01-28: qty 1

## 2024-01-28 MED ORDER — VANCOMYCIN HCL 500 MG IV SOLR
INTRAVENOUS | Status: DC | PRN
  Administered 2024-01-28: 500 mg via TOPICAL

## 2024-01-28 MED ORDER — GLYCOPYRROLATE 0.2 MG/ML IJ SOLN
INTRAMUSCULAR | Status: DC | PRN
Start: 2024-01-28 — End: 2024-01-28
  Administered 2024-01-28: .2 mg via INTRAVENOUS

## 2024-01-28 MED ORDER — CEFAZOLIN SODIUM-DEXTROSE 2-4 GM/100ML-% IV SOLN
2.0000 g | INTRAVENOUS | Status: AC
  Administered 2024-01-28: 2 g via INTRAVENOUS

## 2024-01-28 MED ORDER — PROPOFOL 10 MG/ML IV BOLUS
INTRAVENOUS | Status: DC | PRN
  Administered 2024-01-28: 150 mg via INTRAVENOUS
  Administered 2024-01-28: 60 mg via INTRAVENOUS

## 2024-01-28 MED ORDER — EPHEDRINE SULFATE (PRESSORS) 50 MG/ML IJ SOLN
INTRAMUSCULAR | Status: DC | PRN
Start: 2024-01-28 — End: 2024-01-28
  Administered 2024-01-28 (×2): 5 mg via INTRAVENOUS

## 2024-01-28 MED ORDER — MIDAZOLAM HCL 2 MG/2ML IJ SOLN
2.0000 mg | Freq: Once | INTRAMUSCULAR | Status: AC
  Administered 2024-01-28: 2 mg via INTRAVENOUS

## 2024-01-28 MED ORDER — PHENYLEPHRINE HCL (PRESSORS) 10 MG/ML IV SOLN
INTRAVENOUS | Status: DC | PRN
  Administered 2024-01-28 (×4): 80 ug via INTRAVENOUS

## 2024-01-28 MED ORDER — DEXMEDETOMIDINE HCL IN NACL 80 MCG/20ML IV SOLN
INTRAVENOUS | Status: DC | PRN
Start: 2024-01-28 — End: 2024-01-28
  Administered 2024-01-28 (×2): 8 ug via INTRAVENOUS

## 2024-01-28 MED ORDER — PHENYLEPHRINE 80 MCG/ML (10ML) SYRINGE FOR IV PUSH (FOR BLOOD PRESSURE SUPPORT)
PREFILLED_SYRINGE | INTRAVENOUS | Status: AC
  Filled 2024-01-28: qty 10

## 2024-01-28 MED ORDER — ACETAMINOPHEN 10 MG/ML IV SOLN
INTRAVENOUS | Status: AC
  Filled 2024-01-28: qty 100

## 2024-01-28 MED ORDER — CEFAZOLIN SODIUM-DEXTROSE 2-4 GM/100ML-% IV SOLN
INTRAVENOUS | Status: AC
  Filled 2024-01-28: qty 100

## 2024-01-28 MED ORDER — OXYCODONE HCL 5 MG/5ML PO SOLN: 5.0000 mg | Freq: Once | ORAL | Status: DC | PRN

## 2024-01-28 MED ORDER — ROPIVACAINE HCL 5 MG/ML IJ SOLN
INTRAMUSCULAR | Status: DC | PRN
  Administered 2024-01-28: 30 mL via PERINEURAL

## 2024-01-28 MED ORDER — POVIDONE-IODINE 10 % EX SOLN
CUTANEOUS | Status: DC | PRN
  Administered 2024-01-28: 1 via TOPICAL

## 2024-01-28 MED ORDER — CHLORHEXIDINE GLUCONATE 4 % EX SOLN: 60.0000 mL | Freq: Once | CUTANEOUS | Status: DC

## 2024-01-28 MED ORDER — FENTANYL CITRATE (PF) 100 MCG/2ML IJ SOLN
INTRAMUSCULAR | Status: DC | PRN
  Administered 2024-01-28 (×2): 25 ug via INTRAVENOUS

## 2024-01-28 MED ORDER — EPHEDRINE 5 MG/ML INJ
INTRAVENOUS | Status: AC
  Filled 2024-01-28: qty 5

## 2024-01-28 MED ORDER — SODIUM CHLORIDE 0.9 % IV SOLN
12.5000 mg | INTRAVENOUS | Status: DC | PRN
  Filled 2024-01-28: qty 0.5

## 2024-01-28 MED ORDER — SUGAMMADEX SODIUM 200 MG/2ML IV SOLN
INTRAVENOUS | Status: DC | PRN
  Administered 2024-01-28: 200 mg via INTRAVENOUS

## 2024-01-28 SURGICAL SUPPLY — 52 items
BLADE AVERAGE 25X9 (BLADE) IMPLANT
BLADE MICRO SAGITTAL (BLADE) IMPLANT
BLADE SURG 15 STRL LF DISP TIS (BLADE) ×6 IMPLANT
BNDG COMPR ESMARK 6X3 LF (GAUZE/BANDAGES/DRESSINGS) IMPLANT
BNDG ELASTIC 6X10 VLCR STRL LF (GAUZE/BANDAGES/DRESSINGS) ×3 IMPLANT
BNDG GAUZE DERMACEA FLUFF 4 (GAUZE/BANDAGES/DRESSINGS) ×3 IMPLANT
CANISTER SUCT 1200ML W/VALVE (MISCELLANEOUS) ×3 IMPLANT
CHLORAPREP W/TINT 26 (MISCELLANEOUS) ×3 IMPLANT
COVER BACK TABLE 60X90IN (DRAPES) ×3 IMPLANT
CUFF TRNQT CYL 34X4.125X (TOURNIQUET CUFF) ×3 IMPLANT
DRAPE EXTREMITY T 121X128X90 (DISPOSABLE) ×3 IMPLANT
DRAPE IMP U-DRAPE 54X76 (DRAPES) ×3 IMPLANT
DRAPE OEC MINIVIEW 54X84 (DRAPES) IMPLANT
DRAPE U-SHAPE 47X51 STRL (DRAPES) ×3 IMPLANT
DRSG MEPITEL 4X7.2 (GAUZE/BANDAGES/DRESSINGS) ×3 IMPLANT
ELECTRODE REM PT RTRN 9FT ADLT (ELECTROSURGICAL) ×3 IMPLANT
GAUZE PAD ABD 8X10 STRL (GAUZE/BANDAGES/DRESSINGS) ×15 IMPLANT
GAUZE SPONGE 4X4 12PLY STRL (GAUZE/BANDAGES/DRESSINGS) ×3 IMPLANT
GLOVE BIOGEL PI IND STRL 8 (GLOVE) ×3 IMPLANT
GLOVE SURG SS PI 7.5 STRL IVOR (GLOVE) ×6 IMPLANT
GOWN STRL REUS W/ TWL LRG LVL3 (GOWN DISPOSABLE) ×6 IMPLANT
NDL HYPO 25X1 1.5 SAFETY (NEEDLE) IMPLANT
NDL SUT 6 .5 CRC .975X.05 MAYO (NEEDLE) IMPLANT
NEEDLE HYPO 25X1 1.5 SAFETY (NEEDLE) IMPLANT
PACK BASIN DAY SURGERY FS (CUSTOM PROCEDURE TRAY) ×3 IMPLANT
PAD CAST 4YDX4 CTTN HI CHSV (CAST SUPPLIES) ×3 IMPLANT
PADDING CAST ABS COTTON 4X4 ST (CAST SUPPLIES) IMPLANT
PADDING CAST SYNTHETIC 4X4 STR (CAST SUPPLIES) ×9 IMPLANT
PADDING CAST SYNTHETIC 6X4 NS (CAST SUPPLIES) ×9 IMPLANT
PENCIL SMOKE EVACUATOR (MISCELLANEOUS) ×3 IMPLANT
SANITIZER HAND ALTRA PUMP 550 (MISCELLANEOUS) ×3 IMPLANT
SHEET MEDIUM DRAPE 40X70 STRL (DRAPES) ×3 IMPLANT
SLEEVE SCD COMPRESS KNEE MED (STOCKING) ×3 IMPLANT
SPLINT FIBERGLASS 4X30 (CAST SUPPLIES) ×3 IMPLANT
SPONGE T-LAP 18X18 ~~LOC~~+RFID (SPONGE) ×3 IMPLANT
STOCKINETTE 6 STRL (DRAPES) IMPLANT
SUCTION TUBE FRAZIER 10FR DISP (SUCTIONS) IMPLANT
SUT ETHILON 2 0 FS 18 (SUTURE) ×1 IMPLANT
SUT MNCRL AB 3-0 PS2 18 (SUTURE) IMPLANT
SUT MON AB 2-0 CT1 36 (SUTURE) ×1 IMPLANT
SUT VIC AB 0 CT1 27XBRD ANBCTR (SUTURE) ×3 IMPLANT
SUT VIC AB 1 CT1 27XBRD ANBCTR (SUTURE) ×2 IMPLANT
SUT VIC AB 2-0 CT1 TAPERPNT 27 (SUTURE) ×1 IMPLANT
SUT VIC AB 3-0 SH 27X BRD (SUTURE) ×2 IMPLANT
SUTURE FIBERWR #2 38 T-5 BLUE (SUTURE) IMPLANT
SUTURE TAPE 1.3 FIBERLOP 20 ST (SUTURE) IMPLANT
SYR BULB EAR ULCER 3OZ GRN STR (SYRINGE) ×3 IMPLANT
SYR CONTROL 10ML LL (SYRINGE) IMPLANT
TOWEL GREEN STERILE FF (TOWEL DISPOSABLE) ×6 IMPLANT
TUBE CONNECTING 20X1/4 (TUBING) IMPLANT
UNDERPAD 30X36 HEAVY ABSORB (UNDERPADS AND DIAPERS) ×3 IMPLANT
YANKAUER SUCT BULB TIP NO VENT (SUCTIONS) IMPLANT

## 2024-01-28 NOTE — Anesthesia Procedure Notes (Signed)
 Procedure Name: Intubation Date/Time: 01/28/2024 8:41 AM  Performed by: Denton Niels CROME, CRNAPre-anesthesia Checklist: Patient identified, Emergency Drugs available, Suction available and Patient being monitored Patient Re-evaluated:Patient Re-evaluated prior to induction Oxygen Delivery Method: Circle system utilized Preoxygenation: Pre-oxygenation with 100% oxygen Induction Type: IV induction Ventilation: Mask ventilation without difficulty Laryngoscope Size: Mac and 3 Grade View: Grade I Tube type: Oral Number of attempts: 1 Airway Equipment and Method: Stylet and Oral airway Placement Confirmation: ETT inserted through vocal cords under direct vision, positive ETCO2 and breath sounds checked- equal and bilateral Secured at: 22 cm Tube secured with: Tape Dental Injury: Teeth and Oropharynx as per pre-operative assessment

## 2024-01-28 NOTE — Anesthesia Preprocedure Evaluation (Signed)
 Anesthesia Evaluation  Patient identified by MRN, date of birth, ID band Patient awake    Reviewed: Allergy & Precautions, H&P , NPO status , Patient's Chart, lab work & pertinent test results, reviewed documented beta blocker date and time   History of Anesthesia Complications Negative for: history of anesthetic complications  Airway Mallampati: II  TM Distance: >3 FB Neck ROM: full    Dental  (+) Teeth Intact   Pulmonary asthma (exercise-induced - no inhaler use in years)    breath sounds clear to auscultation       Cardiovascular negative cardio ROS  Rhythm:regular Rate:Normal     Neuro/Psych   Anxiety Depression    negative neurological ROS     GI/Hepatic negative GI ROS, Neg liver ROS,,,  Endo/Other  negative endocrine ROS    Renal/GU negative Renal ROS     Musculoskeletal   Abdominal   Peds  Hematology   Anesthesia Other Findings   Reproductive/Obstetrics                              Anesthesia Physical Anesthesia Plan  ASA: II  Anesthesia Plan: General   Post-op Pain Management: Regional block* and Minimal or no pain anticipated   Induction: Intravenous  PONV Risk Score and Plan: 3 and Ondansetron , Dexamethasone , Midazolam  and Treatment may vary due to age or medical condition  Airway Management Planned: Oral ETT  Additional Equipment:   Intra-op Plan:   Post-operative Plan: Extubation in OR  Informed Consent: I have reviewed the patients History and Physical, chart, labs and discussed the procedure including the risks, benefits and alternatives for the proposed anesthesia with the patient or authorized representative who has indicated his/her understanding and acceptance.       Plan Discussed with:   Anesthesia Plan Comments:          Anesthesia Quick Evaluation

## 2024-01-28 NOTE — Progress Notes (Signed)
Assisted Dr. Miller with left, popliteal, ultrasound guided block. Side rails up, monitors on throughout procedure. See vital signs in flow sheet. Tolerated Procedure well. 

## 2024-01-28 NOTE — Anesthesia Procedure Notes (Signed)
 Anesthesia Regional Block: Popliteal block   Pre-Anesthetic Checklist: , timeout performed,  Correct Patient, Correct Site, Correct Laterality,  Correct Procedure, Correct Position, site marked,  Risks and benefits discussed,  Surgical consent,  Pre-op evaluation,  At surgeon's request and post-op pain management  Laterality: Left  Prep: chloraprep       Needles:  Injection technique: Single-shot  Needle Type: Stimiplex     Needle Length: 9cm  Needle Gauge: 21     Additional Needles:   Procedures:,,,, ultrasound used (permanent image in chart),,    Narrative:  Start time: 01/28/2024 7:58 AM End time: 01/28/2024 8:03 AM Injection made incrementally with aspirations every 5 mL.  Performed by: Personally  Anesthesiologist: Cleotilde Butler Dade, MD

## 2024-01-28 NOTE — Transfer of Care (Signed)
 Immediate Anesthesia Transfer of Care Note  Patient: Stacey Rhodes  Procedure(s) Performed: REPAIR, TENDON, ACHILLES (Left: Ankle)  Patient Location: PACU  Anesthesia Type:GA combined with regional for post-op pain  Level of Consciousness: drowsy and responds to stimulation  Airway & Oxygen Therapy: Patient Spontanous Breathing and Patient connected to face mask oxygen  Post-op Assessment: Report given to RN, Post -op Vital signs reviewed and stable, and Patient moving all extremities  Post vital signs: Reviewed and stable  Last Vitals:  Vitals Value Taken Time  BP 115/70 01/28/24 09:47  Temp 36.2 C 01/28/24 09:49  Pulse 89 01/28/24 09:53  Resp 21 01/28/24 09:53  SpO2 100 % 01/28/24 09:53  Vitals shown include unfiled device data.  Last Pain:  Vitals:   01/28/24 0652  TempSrc: Temporal  PainSc: 0-No pain      Patients Stated Pain Goal: 4 (01/28/24 9347)  Complications: No notable events documented.

## 2024-01-28 NOTE — Anesthesia Postprocedure Evaluation (Signed)
 Anesthesia Post Note  Patient: Stacey Rhodes  Procedure(s) Performed: REPAIR, TENDON, ACHILLES (Left: Ankle)     Patient location during evaluation: PACU Anesthesia Type: General Level of consciousness: awake and alert Pain management: pain level controlled Vital Signs Assessment: post-procedure vital signs reviewed and stable Respiratory status: spontaneous breathing, nonlabored ventilation and respiratory function stable Cardiovascular status: blood pressure returned to baseline and stable Postop Assessment: no apparent nausea or vomiting Anesthetic complications: no   No notable events documented.  Last Vitals:  Vitals:   01/28/24 1000 01/28/24 1030  BP: 111/75 106/75  Pulse: 90 62  Resp: 18 18  Temp:  (!) 36.2 C  SpO2: 98% 100%    Last Pain:  Vitals:   01/28/24 1030  TempSrc:   PainSc: 0-No pain                 Butler Levander Pinal

## 2024-01-28 NOTE — H&P (Signed)

## 2024-01-28 NOTE — Op Note (Signed)
 01/28/2024  3:10 PM   PATIENT: [name]  28 y.o. female  MRN: [id number]   PRE-OPERATIVE DIAGNOSIS:   Rupture of left Achilles tendon, initial encounter   POST-OPERATIVE DIAGNOSIS:   Rupture of left Achilles tendon, initial encounter   PROCEDURE: Left open Achilles tendon primary repair   SURGEON:  Lillia Mountain, MD   ASSISTANT: None   ANESTHESIA: General, regional   EBL: Minimal   TOURNIQUET:    Total Tourniquet Time Documented: Thigh (Left) - 33 minutes Total: Thigh (Left) - 33 minutes    COMPLICATIONS: None apparent   DISPOSITION: Extubated, awake and stable to recovery.   INDICATION FOR PROCEDURE: The patient presented with above diagnosis.  We discussed the diagnosis, alternative treatment options, risks and benefits of the above surgical intervention, as well as alternative non-operative treatments. All questions/concerns were addressed and the patient/family demonstrated appropriate understanding of the diagnosis, the procedure, the postoperative course, and overall prognosis. The patient wished to proceed with surgical intervention and signed an informed surgical consent as such, in each others presence prior to surgery.   PROCEDURE IN DETAIL: After preoperative consent was obtained and the correct operative site was identified, the patient was brought to the operating room supine on stretcher and transferred onto operating table. General anesthesia was induced. Preoperative antibiotics were administered. Surgical timeout was taken. The patient was then positioned prone. The operative lower extremity was prepped and draped in standard sterile fashion with a tourniquet around the thigh. The extremity was exsanguinated and the tourniquet was inflated to 275 mmHg.  A longitudinal incision was made over the palpable defect in the Achilles.  Dissection was carried sharply down through the subcutaneous tissues and peritenon creating full-thickness  flaps medially and laterally.  The Achilles tendon rupture was identified.  Hematoma was debrided.  The tendon ends were trimmed with scissors. The tendon ends were roughly approximated.  #1 Vicryl box sutures on CT-1 ineedle were placed in each tendon stump 1 cm, 2 cm and 3 cm from the rupture site.  The ankle was then maximally plantarflexed.  The tendon ends were then sequentially tied from near to far.  The Banner test was noted to be normal at this point.  A 3-0 Monocryl running circumferential stitch was placed around the tendon to augment the repair.  The surgical sites were thoroughly irrigated. The tourniquet was deflated and hemostasis achieved. Betadine  and vancomycin  powder were applied. The deep layers were closed using 2-0 vicryl. The skin was closed without tension.    The leg was cleaned with saline and sterile dressings with gauze were applied. A well padded bulky short leg splint was applied in plantarflexion. The patient was awakened from anesthesia and transported to the recovery room in stable condition.    FOLLOW UP PLAN: -transfer to PACU, then home -strict NWB operative extremity, maximum elevation -maintain short leg splint until follow up -DVT ppx: Aspirin 81 mg twice daily while NWB -follow up as outpatient within 7-10 days for wound check with exchange of short leg splint to short leg cast -sutures out in 2-3 weeks in outpatient office   RADIOGRAPHS: None   Lillia Mountain Orthopaedic Surgery [facility]

## 2024-01-29 ENCOUNTER — Encounter (HOSPITAL_BASED_OUTPATIENT_CLINIC_OR_DEPARTMENT_OTHER): Payer: Self-pay | Admitting: Orthopaedic Surgery

## 2024-03-11 DIAGNOSIS — M79672 Pain in left foot: Secondary | ICD-10-CM | POA: Diagnosis not present

## 2024-03-17 DIAGNOSIS — M79672 Pain in left foot: Secondary | ICD-10-CM | POA: Diagnosis not present

## 2024-03-23 DIAGNOSIS — M79672 Pain in left foot: Secondary | ICD-10-CM | POA: Diagnosis not present

## 2024-03-30 DIAGNOSIS — M79672 Pain in left foot: Secondary | ICD-10-CM | POA: Diagnosis not present

## 2024-04-01 DIAGNOSIS — M79672 Pain in left foot: Secondary | ICD-10-CM | POA: Diagnosis not present

## 2024-04-12 DIAGNOSIS — M79672 Pain in left foot: Secondary | ICD-10-CM | POA: Diagnosis not present

## 2024-06-02 ENCOUNTER — Other Ambulatory Visit: Payer: Self-pay | Admitting: Nurse Practitioner

## 2024-06-02 DIAGNOSIS — N750 Cyst of Bartholin's gland: Secondary | ICD-10-CM | POA: Diagnosis not present

## 2024-06-02 DIAGNOSIS — Z01419 Encounter for gynecological examination (general) (routine) without abnormal findings: Secondary | ICD-10-CM | POA: Diagnosis not present

## 2024-06-02 DIAGNOSIS — Z7251 High risk heterosexual behavior: Secondary | ICD-10-CM | POA: Diagnosis not present

## 2024-06-02 DIAGNOSIS — N852 Hypertrophy of uterus: Secondary | ICD-10-CM

## 2024-06-08 ENCOUNTER — Ambulatory Visit: Admitting: Physician Assistant

## 2024-06-23 ENCOUNTER — Ambulatory Visit
Admission: RE | Admit: 2024-06-23 | Discharge: 2024-06-23 | Disposition: A | Discharge status: Home / Self Care | Source: Ambulatory Visit | Attending: Nurse Practitioner | Admitting: Nurse Practitioner

## 2024-06-23 DIAGNOSIS — N852 Hypertrophy of uterus: Secondary | ICD-10-CM

## 2025-01-26 ENCOUNTER — Encounter: Admitting: Family Medicine
